# Patient Record
Sex: Female | Born: 1995 | Race: Black or African American | Hispanic: No | Marital: Married | Smoking: Never smoker
Health system: Southern US, Community
[De-identification: ages and names within clinical notes are randomized; demographics above are authoritative.]

## PROBLEM LIST (undated history)

## (undated) DIAGNOSIS — Z789 Other specified health status: Secondary | ICD-10-CM

## (undated) DIAGNOSIS — D649 Anemia, unspecified: Secondary | ICD-10-CM

## (undated) DIAGNOSIS — N390 Urinary tract infection, site not specified: Secondary | ICD-10-CM

## (undated) HISTORY — PX: WISDOM TOOTH EXTRACTION: SHX21

## (undated) HISTORY — DX: Urinary tract infection, site not specified: N39.0

---
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
## 2014-04-16 NOTE — L&D Delivery Note (Signed)
Patient is 19 y.o. [id number] [redacted]w[redacted]d admitted PROM  with SOL. Progressed well without augmentation.    Delivery Note At 2:39 PM a viable female was delivered via Vaginal, Spontaneous Delivery (Presentation: Left Occiput Anterior).  APGAR: 7, 9; weight -pending.   Placenta status: Intact, Spontaneous.  Cord: 3 vessels with the following complications: None.    Anesthesia: Epidural  Episiotomy: None Lacerations: 2nd degree;Perineal;Labial Suture Repair: 3.0 vicryl Est. Blood Loss (mL):    Mom to postpartum.  Baby to Couplet care / Skin to Skin.  Durenda Hurt 01/13/2015, 3:24 PM  OB fellow attestation: Patient is a [id number] at [redacted]w[redacted]d who was admitted for SOL/PROM uncomplicated prenatal course.  I was gloved and present for delivery in its entirety.  Second stage of labor progressed, baby delivered after <30 minutes of pushing. Variable decels during second stage noted. Infant was born with nuchal x1 reduced at perineum and body cord x1.    Complications: none  Lacerations: 2nd degree, repaired in typical fashion  EBL:  Federico Flake, MD 3:34 PM

## 2014-08-23 ENCOUNTER — Other Ambulatory Visit (HOSPITAL_COMMUNITY): Payer: Self-pay | Admitting: Physician Assistant

## 2014-08-23 DIAGNOSIS — Z3689 Encounter for other specified antenatal screening: Secondary | ICD-10-CM

## 2014-08-23 LAB — OB RESULTS CONSOLE RUBELLA ANTIBODY, IGM: RUBELLA: IMMUNE

## 2014-08-23 LAB — OB RESULTS CONSOLE RPR: RPR: NONREACTIVE

## 2014-08-23 LAB — OB RESULTS CONSOLE GC/CHLAMYDIA
CHLAMYDIA, DNA PROBE: NEGATIVE
GC PROBE AMP, GENITAL: NEGATIVE

## 2014-08-23 LAB — OB RESULTS CONSOLE ANTIBODY SCREEN: ANTIBODY SCREEN: NEGATIVE

## 2014-08-23 LAB — OB RESULTS CONSOLE ABO/RH: RH Type: POSITIVE

## 2014-08-23 LAB — OB RESULTS CONSOLE HIV ANTIBODY (ROUTINE TESTING): HIV: NONREACTIVE

## 2014-08-23 LAB — OB RESULTS CONSOLE HEPATITIS B SURFACE ANTIGEN: Hepatitis B Surface Ag: NEGATIVE

## 2014-08-26 ENCOUNTER — Other Ambulatory Visit (HOSPITAL_COMMUNITY): Payer: Self-pay | Admitting: Physician Assistant

## 2014-08-26 ENCOUNTER — Ambulatory Visit (HOSPITAL_COMMUNITY)
Admission: RE | Admit: 2014-08-26 | Discharge: 2014-08-26 | Disposition: A | Discharge status: Home / Self Care | Payer: Medicaid Other | Source: Ambulatory Visit | Attending: Physician Assistant | Admitting: Physician Assistant

## 2014-08-26 ENCOUNTER — Encounter (HOSPITAL_COMMUNITY): Payer: Self-pay

## 2014-08-26 DIAGNOSIS — Z3A19 19 weeks gestation of pregnancy: Secondary | ICD-10-CM | POA: Insufficient documentation

## 2014-08-26 DIAGNOSIS — Z3689 Encounter for other specified antenatal screening: Secondary | ICD-10-CM

## 2014-08-26 DIAGNOSIS — Z36 Encounter for antenatal screening of mother: Secondary | ICD-10-CM | POA: Insufficient documentation

## 2014-09-20 ENCOUNTER — Other Ambulatory Visit (HOSPITAL_COMMUNITY): Payer: Self-pay | Admitting: Urology

## 2014-09-20 DIAGNOSIS — Z3689 Encounter for other specified antenatal screening: Secondary | ICD-10-CM

## 2014-09-24 ENCOUNTER — Ambulatory Visit (HOSPITAL_COMMUNITY)
Admission: RE | Admit: 2014-09-24 | Discharge: 2014-09-24 | Disposition: A | Discharge status: Home / Self Care | Payer: Medicaid Other | Source: Ambulatory Visit | Attending: Urology | Admitting: Urology

## 2014-09-24 ENCOUNTER — Ambulatory Visit (HOSPITAL_COMMUNITY): Payer: Medicaid Other

## 2014-09-24 DIAGNOSIS — Z36 Encounter for antenatal screening of mother: Secondary | ICD-10-CM | POA: Insufficient documentation

## 2014-09-24 DIAGNOSIS — Z3689 Encounter for other specified antenatal screening: Secondary | ICD-10-CM

## 2014-12-21 LAB — OB RESULTS CONSOLE GBS: STREP GROUP B AG: NEGATIVE

## 2015-01-13 ENCOUNTER — Inpatient Hospital Stay (HOSPITAL_COMMUNITY)
Admission: AD | Admit: 2015-01-13 | Discharge: 2015-01-15 | DRG: 775 | Disposition: A | Discharge status: Home / Self Care | Payer: Medicaid Other | Source: Ambulatory Visit | Attending: Obstetrics & Gynecology | Admitting: Obstetrics & Gynecology

## 2015-01-13 ENCOUNTER — Inpatient Hospital Stay (HOSPITAL_COMMUNITY): Payer: Medicaid Other | Admitting: Anesthesiology

## 2015-01-13 ENCOUNTER — Encounter (HOSPITAL_COMMUNITY): Payer: Self-pay | Admitting: *Deleted

## 2015-01-13 DIAGNOSIS — Z3A39 39 weeks gestation of pregnancy: Secondary | ICD-10-CM

## 2015-01-13 DIAGNOSIS — O4292 Full-term premature rupture of membranes, unspecified as to length of time between rupture and onset of labor: Principal | ICD-10-CM | POA: Diagnosis present

## 2015-01-13 DIAGNOSIS — O4290 Premature rupture of membranes, unspecified as to length of time between rupture and onset of labor, unspecified weeks of gestation: Secondary | ICD-10-CM | POA: Diagnosis present

## 2015-01-13 HISTORY — DX: Other specified health status: Z78.9

## 2015-01-13 LAB — CBC
HCT: 31.9 % — ABNORMAL LOW (ref 36.0–46.0)
HEMOGLOBIN: 10.4 g/dL — AB (ref 12.0–15.0)
MCH: 29.6 pg (ref 26.0–34.0)
MCHC: 32.6 g/dL (ref 30.0–36.0)
MCV: 90.9 fL (ref 78.0–100.0)
Platelets: 199 10*3/uL (ref 150–400)
RBC: 3.51 MIL/uL — ABNORMAL LOW (ref 3.87–5.11)
RDW: 15 % (ref 11.5–15.5)
WBC: 7.2 10*3/uL (ref 4.0–10.5)

## 2015-01-13 LAB — TYPE AND SCREEN
ABO/RH(D): O POS
ANTIBODY SCREEN: NEGATIVE

## 2015-01-13 LAB — POCT FERN TEST: POCT Fern Test: POSITIVE

## 2015-01-13 LAB — ABO/RH: ABO/RH(D): O POS

## 2015-01-13 LAB — RPR: RPR Ser Ql: NONREACTIVE

## 2015-01-13 MED ORDER — LANOLIN HYDROUS EX OINT: TOPICAL_OINTMENT | CUTANEOUS | Status: DC | PRN

## 2015-01-13 MED ORDER — DIPHENHYDRAMINE HCL 50 MG/ML IJ SOLN: 12.5000 mg | INTRAMUSCULAR | Status: DC | PRN

## 2015-01-13 MED ORDER — EPHEDRINE 5 MG/ML INJ: 10.0000 mg | INTRAVENOUS | Status: DC | PRN

## 2015-01-13 MED ORDER — LIDOCAINE HCL (PF) 1 % IJ SOLN
30.0000 mL | INTRAMUSCULAR | Status: DC | PRN
  Filled 2015-01-13: qty 30

## 2015-01-13 MED ORDER — BUPIVACAINE HCL (PF) 0.25 % IJ SOLN
INTRAMUSCULAR | Status: DC | PRN
  Administered 2015-01-13 (×2): 4 mL via EPIDURAL

## 2015-01-13 MED ORDER — DIBUCAINE 1 % RE OINT
1.0000 "application " | TOPICAL_OINTMENT | RECTAL | Status: DC | PRN
  Filled 2015-01-13: qty 28

## 2015-01-13 MED ORDER — ACETAMINOPHEN 325 MG PO TABS: 650.0000 mg | ORAL_TABLET | ORAL | Status: DC | PRN

## 2015-01-13 MED ORDER — LACTATED RINGERS IV SOLN
500.0000 mL | INTRAVENOUS | Status: DC | PRN
  Administered 2015-01-13 (×2): 1000 mL via INTRAVENOUS

## 2015-01-13 MED ORDER — LACTATED RINGERS IV SOLN
INTRAVENOUS | Status: DC
  Administered 2015-01-13 (×3): via INTRAVENOUS

## 2015-01-13 MED ORDER — LIDOCAINE-EPINEPHRINE (PF) 2 %-1:200000 IJ SOLN
INTRAMUSCULAR | Status: DC | PRN
  Administered 2015-01-13: 4 mL

## 2015-01-13 MED ORDER — FENTANYL CITRATE (PF) 100 MCG/2ML IJ SOLN
50.0000 ug | INTRAMUSCULAR | Status: DC | PRN
  Administered 2015-01-13 (×2): 100 ug via INTRAVENOUS
  Filled 2015-01-13 (×2): qty 2

## 2015-01-13 MED ORDER — BENZOCAINE-MENTHOL 20-0.5 % EX AERO
1.0000 "application " | INHALATION_SPRAY | CUTANEOUS | Status: DC | PRN
  Administered 2015-01-13: 1 via TOPICAL
  Filled 2015-01-13 (×2): qty 56

## 2015-01-13 MED ORDER — OXYTOCIN 40 UNITS IN LACTATED RINGERS INFUSION - SIMPLE MED
62.5000 mL/h | INTRAVENOUS | Status: DC
  Administered 2015-01-13: 62.5 mL/h via INTRAVENOUS
  Filled 2015-01-13: qty 1000

## 2015-01-13 MED ORDER — INFLUENZA VAC SPLIT QUAD 0.5 ML IM SUSY
0.5000 mL | PREFILLED_SYRINGE | INTRAMUSCULAR | Status: AC
  Administered 2015-01-15: 0.5 mL via INTRAMUSCULAR

## 2015-01-13 MED ORDER — PRENATAL MULTIVITAMIN CH
1.0000 | ORAL_TABLET | Freq: Every day | ORAL | Status: DC
  Administered 2015-01-14 – 2015-01-15 (×2): 1 via ORAL
  Filled 2015-01-13 (×2): qty 1

## 2015-01-13 MED ORDER — OXYCODONE-ACETAMINOPHEN 5-325 MG PO TABS
1.0000 | ORAL_TABLET | ORAL | Status: DC | PRN
Start: 2015-01-13 — End: 2015-01-15

## 2015-01-13 MED ORDER — FENTANYL 2.5 MCG/ML BUPIVACAINE 1/10 % EPIDURAL INFUSION (WH - ANES)
14.0000 mL/h | INTRAMUSCULAR | Status: DC | PRN
  Administered 2015-01-13: 12 mL/h via EPIDURAL
  Filled 2015-01-13: qty 125

## 2015-01-13 MED ORDER — OXYCODONE-ACETAMINOPHEN 5-325 MG PO TABS: 2.0000 | ORAL_TABLET | ORAL | Status: DC | PRN

## 2015-01-13 MED ORDER — IBUPROFEN 600 MG PO TABS
600.0000 mg | ORAL_TABLET | Freq: Four times a day (QID) | ORAL | Status: DC
  Administered 2015-01-13 – 2015-01-15 (×8): 600 mg via ORAL
  Filled 2015-01-13 (×8): qty 1

## 2015-01-13 MED ORDER — CITRIC ACID-SODIUM CITRATE 334-500 MG/5ML PO SOLN: 30.0000 mL | ORAL | Status: DC | PRN

## 2015-01-13 MED ORDER — PHENYLEPHRINE 40 MCG/ML (10ML) SYRINGE FOR IV PUSH (FOR BLOOD PRESSURE SUPPORT)
80.0000 ug | PREFILLED_SYRINGE | INTRAVENOUS | Status: DC | PRN
  Filled 2015-01-13: qty 20

## 2015-01-13 MED ORDER — ONDANSETRON HCL 4 MG PO TABS: 4.0000 mg | ORAL_TABLET | ORAL | Status: DC | PRN

## 2015-01-13 MED ORDER — SENNOSIDES-DOCUSATE SODIUM 8.6-50 MG PO TABS
2.0000 | ORAL_TABLET | ORAL | Status: DC
  Administered 2015-01-14 (×2): 2 via ORAL
  Filled 2015-01-13 (×2): qty 2

## 2015-01-13 MED ORDER — OXYTOCIN BOLUS FROM INFUSION
500.0000 mL | INTRAVENOUS | Status: DC
  Administered 2015-01-13: 500 mL via INTRAVENOUS

## 2015-01-13 MED ORDER — OXYCODONE-ACETAMINOPHEN 5-325 MG PO TABS: 1.0000 | ORAL_TABLET | ORAL | Status: DC | PRN

## 2015-01-13 MED ORDER — WITCH HAZEL-GLYCERIN EX PADS: 1.0000 "application " | MEDICATED_PAD | CUTANEOUS | Status: DC | PRN

## 2015-01-13 MED ORDER — ONDANSETRON HCL 4 MG/2ML IJ SOLN: 4.0000 mg | Freq: Four times a day (QID) | INTRAMUSCULAR | Status: DC | PRN

## 2015-01-13 MED ORDER — DIPHENHYDRAMINE HCL 25 MG PO CAPS: 25.0000 mg | ORAL_CAPSULE | Freq: Four times a day (QID) | ORAL | Status: DC | PRN

## 2015-01-13 MED ORDER — ZOLPIDEM TARTRATE 5 MG PO TABS: 5.0000 mg | ORAL_TABLET | Freq: Every evening | ORAL | Status: DC | PRN

## 2015-01-13 MED ORDER — ONDANSETRON HCL 4 MG/2ML IJ SOLN: 4.0000 mg | INTRAMUSCULAR | Status: DC | PRN

## 2015-01-13 MED ORDER — SIMETHICONE 80 MG PO CHEW: 80.0000 mg | CHEWABLE_TABLET | ORAL | Status: DC | PRN

## 2015-01-13 NOTE — Anesthesia Preprocedure Evaluation (Addendum)
Anesthesia Evaluation  Patient identified by MRN, date of birth, ID band Patient awake    Reviewed: Allergy & Precautions, Patient's Chart, lab work & pertinent test results  History of Anesthesia Complications Negative for: history of anesthetic complications  Airway Mallampati: III  TM Distance: >3 FB Neck ROM: Full    Dental  (+) Teeth Intact   Pulmonary neg pulmonary ROS,    breath sounds clear to auscultation       Cardiovascular negative cardio ROS   Rhythm:Regular     Neuro/Psych negative neurological ROS  negative psych ROS   GI/Hepatic negative GI ROS, Neg liver ROS,   Endo/Other  negative endocrine ROS  Renal/GU negative Renal ROS     Musculoskeletal   Abdominal   Peds  Hematology negative hematology ROS (+)   Anesthesia Other Findings   Reproductive/Obstetrics (+) Pregnancy                            Anesthesia Physical Anesthesia Plan  ASA: II  Anesthesia Plan: Epidural   Post-op Pain Management:    Induction:   Airway Management Planned: Nasal Cannula  Additional Equipment:   Intra-op Plan:   Post-operative Plan:   Informed Consent: I have reviewed the patients History and Physical, chart, labs and discussed the procedure including the risks, benefits and alternatives for the proposed anesthesia with the patient or authorized representative who has indicated his/her understanding and acceptance.     Plan Discussed with: Anesthesiologist  Anesthesia Plan Comments:         Anesthesia Quick Evaluation

## 2015-01-13 NOTE — Anesthesia Postprocedure Evaluation (Signed)
Anesthesia Post Note  Patient: Catherine Winters  Procedure(s) Performed: * No procedures listed *  Anesthesia type: Epidural  Patient location: Mother/Baby  Post pain: Pain level controlled  Post assessment: Post-op Vital signs reviewed  Last Vitals:  Filed Vitals:   01/13/15 1800  BP: 105/49  Pulse: 81  Temp:   Resp: 18    Post vital signs: Reviewed  Level of consciousness:alert  Complications: No apparent anesthesia complications

## 2015-01-13 NOTE — Anesthesia Procedure Notes (Signed)
Epidural Patient location during procedure: OB  Staffing Anesthesiologist: MOSER, CHRISTOPHER Performed by: anesthesiologist   Preanesthetic Checklist Completed: patient identified, surgical consent, pre-op evaluation, timeout performed, IV checked, risks and benefits discussed and monitors and equipment checked  Epidural Patient position: sitting Prep: DuraPrep Patient monitoring: heart rate, cardiac monitor, continuous pulse ox and blood pressure Approach: midline Location: L3-L4 Injection technique: LOR saline  Needle:  Needle type: Tuohy  Needle gauge: 17 G Needle length: 9 cm Needle insertion depth: 6 cm Catheter type: closed end flexible Catheter size: 19 Gauge Catheter at skin depth: 12 cm Test dose: negative and 2% lidocaine with Epi 1:200 K  Assessment Events: blood not aspirated, injection not painful, no injection resistance, negative IV test and no paresthesia  Additional Notes Reason for block:procedure for pain   

## 2015-01-13 NOTE — H&P (Signed)
LABOR ADMISSION HISTORY AND PHYSICAL  Catherine Winters is a 19 y.o. female G2P0010 with IUP at [redacted]w[redacted]d by LMP (04/10/14) presenting for PROM. She reports +FM, + contractions, No LOF, no VB, no blurry vision, headaches or peripheral edema, and RUQ pain.  She plans on breast feeding. She is undecided on method of contraception.  Dating: By LMP (04/10/14) --->  Estimated Date of Delivery: 01/15/15  Sono:    , CWD, normal anatomy, cephalic presentation, longitudinal lie, 691g, 62% EFW   Past Medical History: Past Medical History  Diagnosis Date  . Medical history non-contributory     Past Surgical History: Past Surgical History  Procedure Laterality Date  . Wisdom tooth extraction      Obstetrical History: OB History    Gravida Para Term Preterm AB TAB SAB Ectopic Multiple Living        Social History: Social History   Social History  . Marital Status: Married    Spouse Name: N/A  . Number of Children: N/A  . Years of Education: N/A   Social History Main Topics  . Smoking status: Never Smoker   . Smokeless tobacco: None  . Alcohol Use: No  . Drug Use: No  . Sexual Activity: Not Currently   Other Topics Concern  . None   Social History Narrative    Family History: History reviewed. No pertinent family history.  Allergies: No Known Allergies  Prescriptions prior to admission  Medication Sig Dispense Refill Last Dose  . Prenatal Vit-Fe Fumarate-FA (MULTIVITAMIN-PRENATAL) 27-0.8 MG TABS tablet Take 1 tablet by mouth daily at 12 noon.   01/12/2015 at Unknown time  . [DISCONTINUED] HYDROcodone-acetaminophen (NORCO/VICODIN) 5-325 MG per tablet Take 2 tablets by mouth every 6 (six) hours as needed for pain. 20 tablet 0 More than a month at Unknown time  . [DISCONTINUED] nitrofurantoin, macrocrystal-monohydrate, (MACROBID) 100 MG capsule Take 1 capsule (100 mg total) by mouth 2 (two) times daily. 14 capsule 0 More than a month at Unknown time      Review of Systems   All systems reviewed and negative except as stated in HPI  BP 121/82 mmHg  Pulse 91  Temp(Src) 98.2 F (36.8 C) (Oral)  Resp 20  Ht  (1.702 m)  Wt 192 lb (87.091 kg)  BMI 30.06 kg/m2  SpO2 100%  LMP 04/10/2014 (Exact Date) General appearance: alert, cooperative and moderate distress Lungs: non-labored breathing Heart: regular rate Abdomen: soft, non-tender Extremities: no sign of DVT, no edema Presentation: cephalic Fetal monitoringBaseline: 130 bpm, Variability: Good {> 6 bpm) and Accelerations: Reactive Uterine activityFrequency: Every 3-4 minutes, Duration: 60-80 seconds and Intensity: moderate Dilation: 2 Effacement (%): 60 Exam by:: D Collison RN   Prenatal labs: ABO, Rh: --/--/O POS, O POS (09/29 0400)O Antibody: NEG (09/29 0400)Neg Rubella:  Immune RPR: Nonreactive (05/09 0000) Non-reactive HBsAg: Negative (05/09 0000) Neg HIV: Non-reactive (05/09 0000) Non-reactive GBS: Negative (09/06 0000) Neg 1 hr Glucola: 99 Genetic screening: No abnormalities Anatomy US: No abnormalities   Prenatal Transfer Tool  Maternal Diabetes: No Genetic Screening: Normal Maternal Ultrasounds/Referrals: Normal Fetal Ultrasounds or other Referrals:  None Maternal Substance Abuse:  No Significant Maternal Medications:  None Significant Maternal Lab Results: None  Results for orders placed or performed during the hospital encounter of 01/13/15 (from the past 24 hour(s))  Fern Test   Collection Time: 01/13/15  3:53 AM  Result Value Ref Range   POCT Fern Test Positive =  ruptured amniotic membanes   CBC   Collection Time: 01/13/15  4:00 AM  Result Value Ref Range   WBC 7.2 4.0 - 10.5 K/uL   RBC 3.51 (L) 3.87 - 5.11 MIL/uL   Hemoglobin 10.4 (L) 12.0 - 15.0 g/dL   HCT 16.1 (L) 09.6 - 04.5 %   MCV 90.9 78.0 - 100.0 fL   MCH 29.6 26.0 - 34.0 pg   MCHC 32.6 30.0 - 36.0 g/dL   RDW 40.9 81.1 - 91.4 %   Platelets 199 150 - 400 K/uL  Type and screen    Collection Time: 01/13/15  4:00 AM  Result Value Ref Range   ABO/RH(D) O POS    Antibody Screen NEG    Sample Expiration 01/16/2015   ABO/Rh   Collection Time: 01/13/15  4:00 AM  Result Value Ref Range   ABO/RH(D) O POS     Patient Active Problem List   Diagnosis Date Noted  . PROM (premature rupture of membranes) 01/13/2015  . Encounter for fetal anatomic survey   . [redacted] weeks gestation of pregnancy     Assessment: Catherine Winters is a 19 y.o. G2P0010 at [redacted]w[redacted]d here for IOL for PROM.  #Labor:Expectant management #Pain: Patient desires unmedicated labor #FWB: Cat 1 #ID:  GBS neg #MOF: Breast #MOC:Undecided #Circ:  Outpatient  Tarri Abernethy, MD PGY-1 [facility]   CNM attestation:  I have seen and examined this patient; I agree with above documentation in the resident's note.   Catherine Winters is a 19 y.o. G2P0010 here for SROM  PE: BP 124/77 mmHg  Pulse 83  Temp(Src) 98.7 F (37.1 C) (Oral)  Resp 20  Ht  (1.702 m)  Wt 87.091 kg (192 lb)  BMI 30.06 kg/m2  SpO2 100%  LMP 04/10/2014 (Exact Date) Gen: calm comfortable, NAD Resp: normal effort, no distress Abd: gravid  ROS, labs, PMH reviewed  Plan: Admit to [facility] Expectant management for now Anticipate SVD  Cam Hai CNM 01/13/2015, 8:44 AM

## 2015-01-14 NOTE — Progress Notes (Cosign Needed)
Post Partum Day 1 Subjective:  Catherine Winters is a 19 y.o. G2P1011 [redacted]w[redacted]d s/p SVD after SOL.  No acute events overnight.  Pt denies problems with ambulating, voiding or po intake.  She denies nausea or vomiting.  Pain is well controlled.  She has had flatus. She has not had bowel movement.  Lochia Small.  Plan for birth control is undecided.  Method of Feeding: breast  Objective: Blood pressure 115/60, pulse 74, temperature 99.4 F (37.4 C), temperature source Oral, resp. rate 20, height  (1.702 m), weight 192 lb (87.091 kg), last menstrual period 04/10/2014, SpO2 100 %, unknown if currently breastfeeding.  Physical Exam:  General: alert, cooperative and no distress Lochia:normal flow Chest: CTAB Heart: RRR no m/r/g Abdomen: +BS, soft, nontender,  Uterine Fundus: firm, at level of umbilicus DVT Evaluation: No evidence of DVT seen on physical exam. Extremities: no edema   Recent Labs  01/13/15 0400  HGB 10.4*  HCT 31.9*    Assessment/Plan:  ASSESSMENT: Catherine Winters is a 19 y.o. [id number] [redacted]w[redacted]d s/p SVD after SOL.  Plan for discharge tomorrow, Breastfeeding and Lactation consult   LOS: 1 day   Durenda Hurt 01/14/2015, 9:31 AM

## 2015-01-14 NOTE — Progress Notes (Signed)
UR chart review completed.  

## 2015-01-14 NOTE — Lactation Note (Signed)
This note was copied from the chart of Catherine Winters. Lactation Consultation Note New mom doing STS w/baby after bath. Mom stated baby BF good earlier. Mom has everted nipples, hand expression taught w/colostrum noted. Mom communicated well asking questions about pumping, freezing milk when she returns to work. Gave reference on storing and freezing milk in Baby and Me book. Referred to Baby and Me Book in Breastfeeding section Pg. 22-23 for position options and Proper latch demonstration.Mom encouraged to feed baby 8-12 times/24 hours and with feeding cues. Mom encouraged to do skin-to-skin. Mom has a pump at home. WH/LC brochure given w/resources, support groups and LC services. Patient Name: Catherine Dorlisa Savino [id number] Date: 01/14/2015 Reason for consult: Initial assessment   Maternal Data    Feeding Feeding Type: Breast Fed Length of feed: 15 min  LATCH Score/Interventions Latch: Too sleepy or reluctant, no latch achieved, no sucking elicited. Intervention(s): Skin to skin;Teach feeding cues;Waking techniques Intervention(s): Breast massage;Breast compression     Type of Nipple: Everted at rest and after stimulation  Comfort (Breast/Nipple): Soft / non-tender     Intervention(s): Skin to skin;Position options;Support Pillows;Breastfeeding basics reviewed     Lactation Tools Discussed/Used     Consult Status Consult Status: Follow-up Date: 01/14/15 Follow-up type: In-patient    CARVER, Diamond Nickel 01/14/2015, 12:01 AM

## 2015-01-15 ENCOUNTER — Ambulatory Visit: Payer: Self-pay

## 2015-01-15 MED ORDER — IBUPROFEN 600 MG PO TABS: 600.0000 mg | ORAL_TABLET | Freq: Four times a day (QID) | ORAL | Status: DC | PRN

## 2015-01-15 NOTE — Discharge Summary (Signed)
OB Discharge Summary     Patient Name: Catherine Winters DOB: 06-09-1995 MRN: [id number]  Date of admission: 01/13/2015 Delivering MD: Maryjo Rochester L   Date of discharge: 01/15/2015  Admitting diagnosis: 39 WEEKS ROM CTX Intrauterine pregnancy: [redacted]w[redacted]d     Secondary diagnosis: None     Discharge diagnosis: Term Pregnancy Delivered                                                                                                Post partum procedures:none  Augmentation: none  Complications: None  Hospital course:  Onset of Labor With Vaginal Delivery     19 y.o. yo G2P1011 at [redacted]w[redacted]d was admitted in Latent Labor with SROM on 01/13/2015. Patient had an uncomplicated labor course as follows:  Membrane Rupture Time/Date: 3:00 AM ,01/13/2015   Intrapartum Procedures: Episiotomy: None [1]                                         Lacerations:  2nd degree [3];Perineal [11];Labial [10]  Patient had a delivery of a Viable infant. 01/13/2015  Information for the patient's newborn:  Nakisha, Chai [[id number]]  Delivery Method: Vaginal, Spontaneous Delivery (Filed from Delivery Summary)    Pateint had an uncomplicated postpartum course.  She is ambulating, tolerating a regular diet, passing flatus, and urinating well. Patient is discharged home in stable condition on No discharge date for patient encounter.Marland Kitchen    Physical exam  Filed Vitals:   01/13/15 2140 01/14/15 0609 01/14/15 1858 01/15/15 0500  BP: 124/60 115/60 120/61 133/75  Pulse: 97 74 93 99  Temp: 98.3 F (36.8 C) 99.4 F (37.4 C) 98.7 F (37.1 C) 98.1 F (36.7 C)  TempSrc:  Oral Oral Oral  Resp: Height:      Weight:      SpO2: 100%      General: alert and cooperative Lochia: appropriate Uterine Fundus: firm Incision: N/A DVT Evaluation: No evidence of DVT seen on physical exam. Labs: Lab Results  Component Value Date   WBC 7.2 01/13/2015   HGB 10.4* 01/13/2015   HCT 31.9* 01/13/2015   MCV 90.9  01/13/2015   PLT 199 01/13/2015   CMP Latest Ref Rng 11/22/2012  Glucose 70 - 99 mg/dL 784(O)  BUN 6 - 23 mg/dL 5(L)  Creatinine 9.62 - 1.10 mg/dL 9.52  Sodium 841 - 324 mEq/L 133(L)  Potassium 3.5 - 5.1 mEq/L 3.5  Chloride 96 - 112 mEq/L 98  CO2 19 - 32 mEq/L 23  Calcium 8.4 - 10.5 mg/dL 9.8    Discharge instruction: per After Visit Summary and "Baby and Me Booklet".  Medications:   Medication List    TAKE these medications        ibuprofen 600 MG tablet  Commonly known as:  ADVIL,MOTRIN  Take 1 tablet (600 mg total) by mouth every 6 (six) hours as needed.     multivitamin-prenatal 27-0.8 MG Tabs tablet  Take 1 tablet by mouth daily at  12 noon.       Diet: routine diet  Activity: Advance as tolerated. Pelvic rest for 6 weeks.   Outpatient follow up:6 weeks  Postpartum contraception: Undecided  Newborn Data: Live born female  Birth Weight: 7 lb 7.2 oz (3380 g) APGAR: 7, 9  Baby Feeding: Breast Disposition:home with mother   01/15/2015 Cam Hai, CNM  9:05 AM

## 2015-01-15 NOTE — Lactation Note (Signed)
This note was copied from the chart of Catherine Zani Huesca. Lactation Consultation Note: assist mother with latching infant. Mother has a positional strip on the left nipple. Assist mother with latching infant on the left breast in cross cradle hold. Infant sustained latch for 30 mins. Observed frequent audible swallows. . Mother taught breast compression. Mother is active with WIC . She was advised to phone [facility] on Monday. Mother has a hand pump . Advised to prepump for a few mins to soften breast tissue before latching infant. Assist mother with placing infant in football hold on alternate breast. Infant sustained latch for another 10-15 mins. Reviewed baby and me book on engorgement. Discussed cluster feeding. Advised mother to do frequent skin to skin and cue base feed infant. Mother to follow up with Peds on Monday.   Patient Name: Catherine Winters [id number] Date: 01/15/2015 Reason for consult: Follow-up assessment   Maternal Data    Feeding Feeding Type: Breast Fed Length of feed: 30 min  LATCH Score/Interventions Latch: Grasps breast easily, tongue down, lips flanged, rhythmical sucking. Intervention(s): Adjust position;Assist with latch;Breast massage;Breast compression  Audible Swallowing: Spontaneous and intermittent Intervention(s): Skin to skin;Hand expression Intervention(s): Hand expression;Alternate breast massage  Type of Nipple: Everted at rest and after stimulation  Comfort (Breast/Nipple): Filling, red/small blisters or bruises, mild/mod discomfort     Hold (Positioning): Assistance needed to correctly position infant at breast and maintain latch. Intervention(s): Breastfeeding basics reviewed;Support Pillows;Position options;Skin to skin  LATCH Score: 8  Lactation Tools Discussed/Used     Consult Status Consult Status: Follow-up Date: 01/15/15 Follow-up type: In-patient    Stevan Born [facility] 01/15/2015, 2:07 PM

## 2015-01-15 NOTE — Discharge Instructions (Signed)

## 2016-01-01 IMAGING — US US OB FOLLOW-UP
1 series · 12 of 28 positions shown · non-contrast
Comparison: none

[Series 1: us ob follow up · 55 acquisitions, 12 frames shown]
[im 3/55]
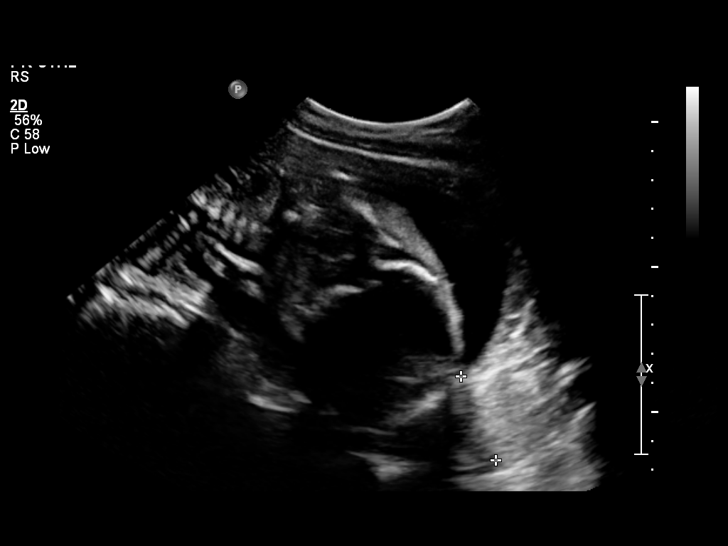
[im 7/55]
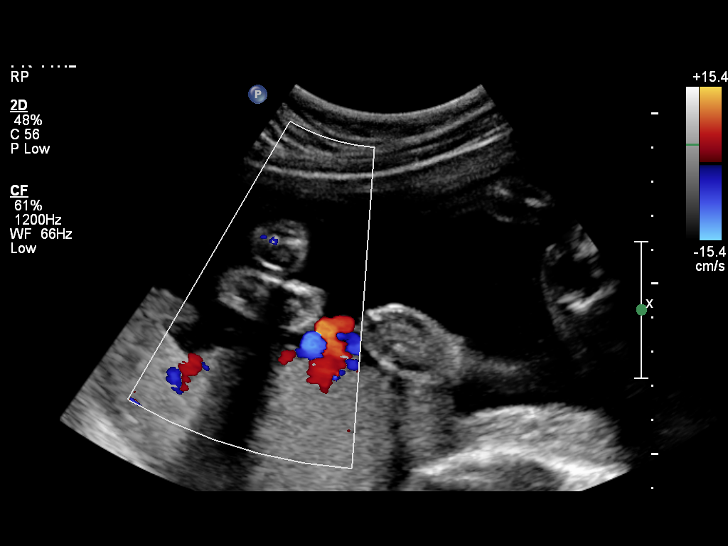
[im 11/55]
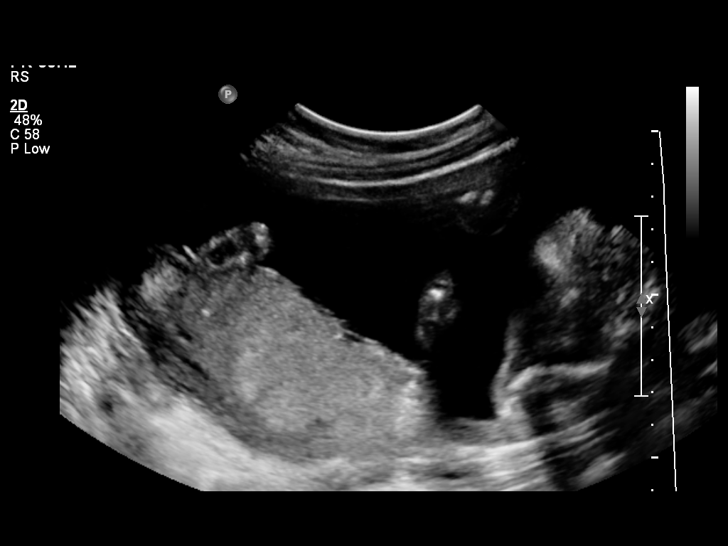
[im 17/55]
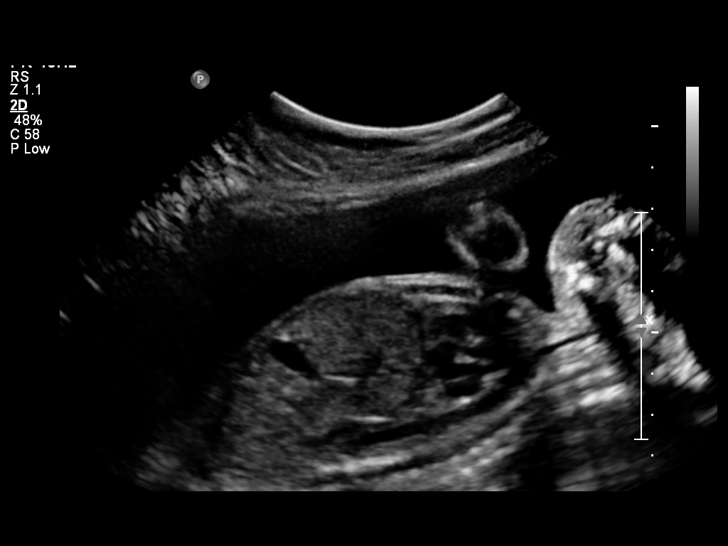
[im 21/55]
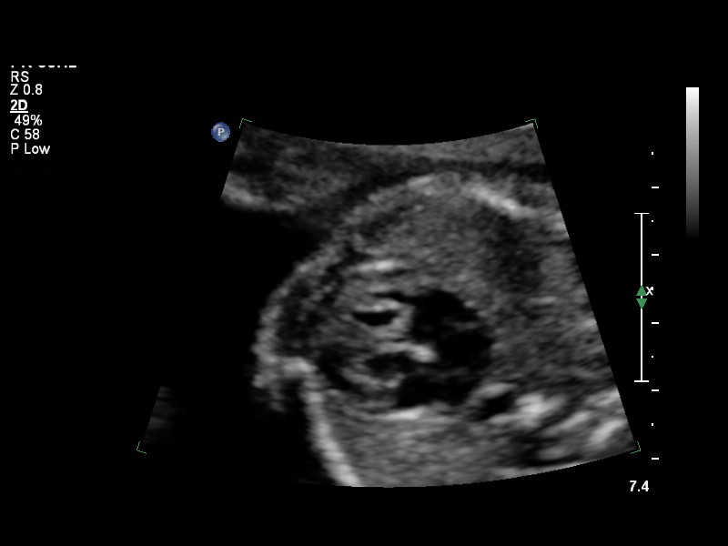
[im 25/55]
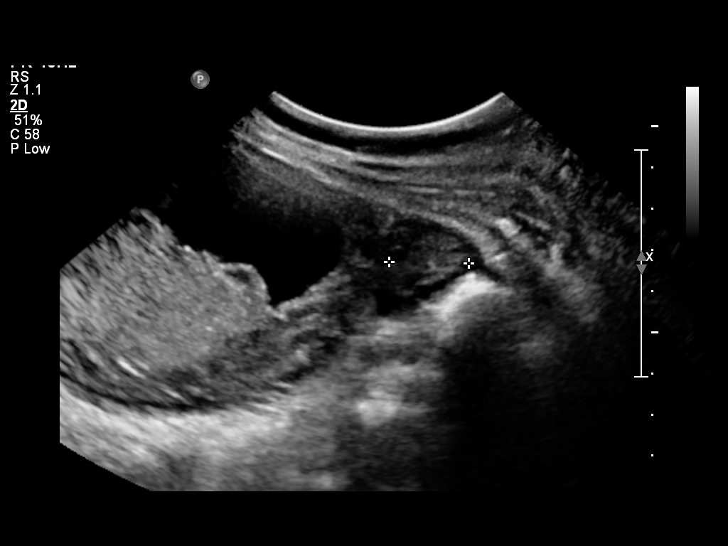
[im 31/55]
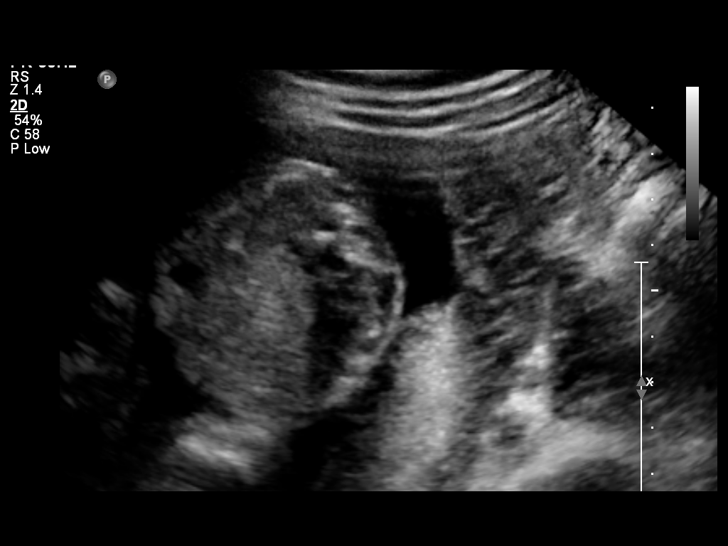
[im 35/55]
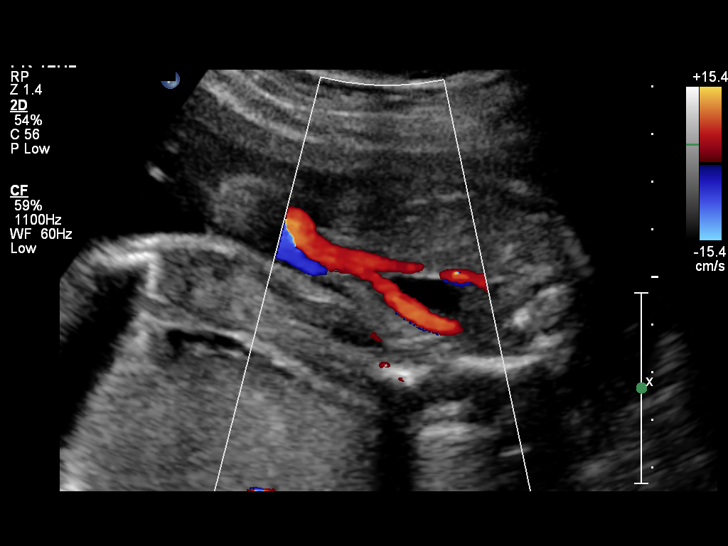
[im 39/55]
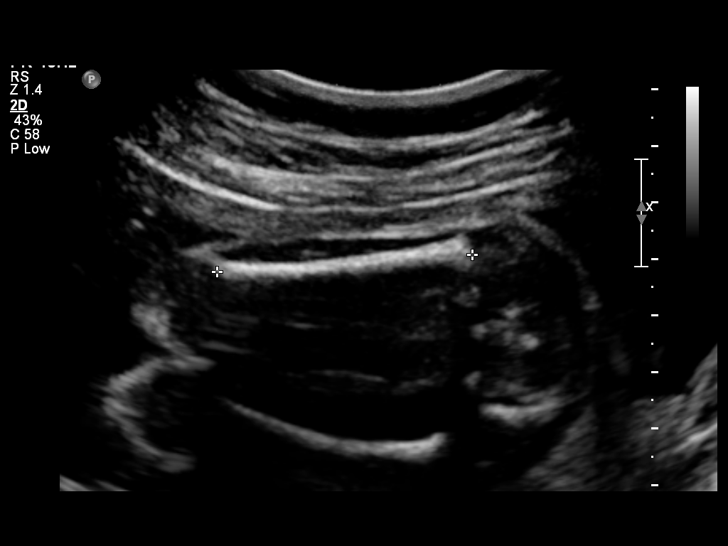
[im 45/55]
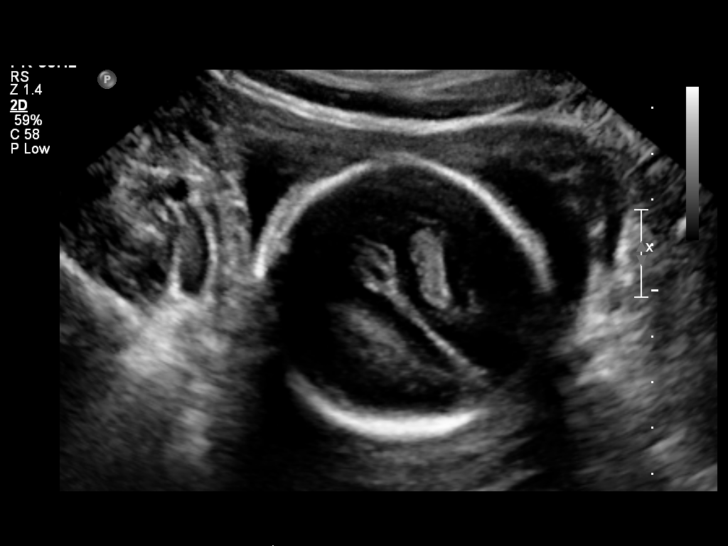
[im 49/55]
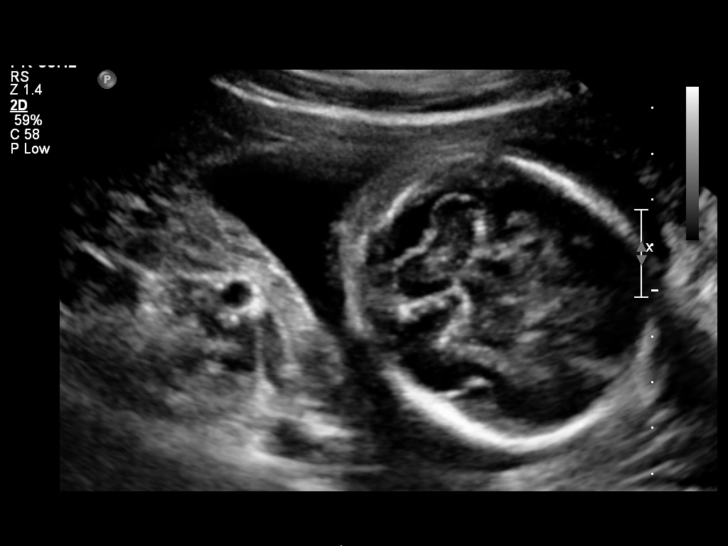
[im 53/55]
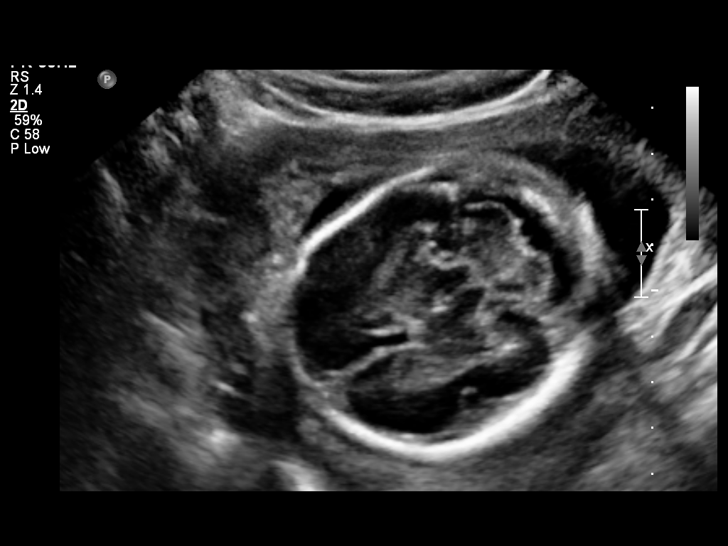

[12 of 28 positions shown; findings below may reference images not displayed]

OBSTETRICS REPORT
(Signed Final 09/27/2014 [DATE])

Service(s) Provided

US OB FOLLOW UP                                       76816.1
Indications

23 weeks gestation of pregnancy
Follow-up incomplete fetal anatomic evaluation        Z36
Fetal Evaluation

Num Of Fetuses:    1
Fetal Heart Rate:  147                          bpm
Cardiac Activity:  Observed
Presentation:      Cephalic
Placenta:          Posterior, above cervical
os
P. Cord            Visualized
Insertion:

Amniotic Fluid
AFI FV:      Subjectively within normal limits
Larg Pckt:    4.4  cm
Biometry

BPD:     57.7   mm    G. Age:  23w 5d                CI:        70.49    70 - 86
FL/HC:       20.3   18.7 -
20.9
HC:     219.1   mm    G. Age:  23w 6d       36   %   HC/AC:       1.11   1.05 -
1.21
AC:     196.8   mm    G. Age:  24w 3d       57   %   FL/BPD:      77.1   71 - 87
FL:      44.5   mm    G. Age:  24w 5d       63   %   FL/AC:       22.6   20 - 24
HUM:     42.7   mm    G. Age:  25w 4d       84   %
Est. FW:     691   gm     1 lb 8 oz     62  %
Gestational Age

LMP:           23w 6d        Date:  04/10/14                 EDD:    01/15/15
U/S Today:     24w 1d                                        EDD:    01/13/15
Best:          23w 6d     Det. By:  LMP  (04/10/14)          EDD:    01/15/15
Anatomy

Cranium:          Appears normal         Aortic Arch:       Previously seen
Fetal Cavum:      Appears normal         Ductal Arch:       Appears normal
Ventricles:       Appears normal         Diaphragm:         Appears normal
Choroid Plexus:   Appears normal         Stomach:           Appears normal, left
sided
Cerebellum:       Appears normal         Abdomen:           Appears normal
Posterior Fossa:  Appears normal         Abdominal Wall:    Appears nml (cord
insert, abd wall)
Nuchal Fold:      Previously seen        Cord Vessels:      Appears normal (3
vessel cord)
Face:             Appears normal         Kidneys:           Appear normal
(orbits and profile)
Lips:             Previously seen        Bladder:           Appears normal
Heart:            Appears normal         Spine:             Previously seen
(4CH, axis, and
situs)
RVOT:             Appears normal         Lower              Previously seen
Extremities:
LVOT:             Appears normal         Upper              Previously seen
Extremities:

Other:  Fetus appears to be a male. Heels and 5th digit prev.  visualized.
Technically difficult due to fetal position.
Cervix Uterus Adnexa

Cervical Length:    3.12      cm

Cervix:       Normal appearance by transabdominal scan.

Left Ovary:    Size(cm) L: 1.99 x W: 1.92 x H: 1.62  Volume(cc):
Right Ovary:   Not visualized. No adnexal mass visualized.
Impression

SIUP at 23+6 weeks
Normal interval anatomy; anatomic survey complete
Normal amniotic fluid volume
Appropriate interval growth with EFW at the 62nd %tile
Recommendations

Follow-up as clinically indicated

questions or concerns.

## 2016-04-16 NOTE — L&D Delivery Note (Signed)
Delivery Note At 3:38 AM a viable female was delivered via Vaginal, Spontaneous (Presentation: LOA).  APGAR:9,8; weight pending .   Placenta status: Delivered intact with gentle traction.  Cord:  with the following complications: shoulder dystocia  Cord pH: Not collected  Anesthesia: Epidural Episiotomy:  None Lacerations: Perineal;2nd degree Suture Repair: 3.0 vicryl Est. Blood Loss (mL): 250  Mom to postpartum.  Baby to Couplet care / Skin to Skin.  Lovena NeighboursAbdoulaye Diallo, MD 03/07/2017, 4:13 AM  I confirm that I have verified the information documented in the resident's note and that I have also personally performed the physical exam and all medical decision making activities.  I was gloved and present for entire delivery  SVD with shoulder dystocia.  Head emerged slowly so I had staff prepare for a possible shoulder dystocia.  Mcroberts employed with no success.  I attempted anterior shoulder again without success.  Suprapubic pressure was applied then applied again.  I was then able to get into anterior axilla and deliver anterior (right)shoulder then left/posterior.  Total time about 1 minute.  Lacerations as listed above Repair of same supervised by me  Aviva SignsMarie L Jasten Guyette, CNM

## 2016-09-24 LAB — OB RESULTS CONSOLE GC/CHLAMYDIA
Chlamydia: NEGATIVE
GC PROBE AMP, GENITAL: NEGATIVE

## 2016-09-24 LAB — OB RESULTS CONSOLE HIV ANTIBODY (ROUTINE TESTING): HIV: NONREACTIVE

## 2016-09-24 LAB — OB RESULTS CONSOLE RPR: RPR: NONREACTIVE

## 2016-09-24 LAB — OB RESULTS CONSOLE ABO/RH: RH Type: POSITIVE

## 2016-09-24 LAB — OB RESULTS CONSOLE HEPATITIS B SURFACE ANTIGEN: HEP B S AG: NEGATIVE

## 2016-09-24 LAB — OB RESULTS CONSOLE RUBELLA ANTIBODY, IGM: RUBELLA: IMMUNE

## 2016-09-24 LAB — OB RESULTS CONSOLE ANTIBODY SCREEN: ANTIBODY SCREEN: NEGATIVE

## 2017-02-04 LAB — OB RESULTS CONSOLE GC/CHLAMYDIA
CHLAMYDIA, DNA PROBE: NEGATIVE
Gonorrhea: NEGATIVE

## 2017-02-04 LAB — OB RESULTS CONSOLE GBS: STREP GROUP B AG: POSITIVE

## 2017-03-04 ENCOUNTER — Other Ambulatory Visit: Payer: Self-pay | Admitting: Obstetrics & Gynecology

## 2017-03-04 ENCOUNTER — Other Ambulatory Visit (HOSPITAL_COMMUNITY): Payer: Self-pay | Admitting: Family

## 2017-03-04 DIAGNOSIS — O480 Post-term pregnancy: Secondary | ICD-10-CM

## 2017-03-04 DIAGNOSIS — Z3A40 40 weeks gestation of pregnancy: Secondary | ICD-10-CM

## 2017-03-05 ENCOUNTER — Other Ambulatory Visit: Payer: Self-pay | Admitting: Advanced Practice Midwife

## 2017-03-05 ENCOUNTER — Encounter (HOSPITAL_COMMUNITY): Payer: Self-pay | Admitting: *Deleted

## 2017-03-06 ENCOUNTER — Inpatient Hospital Stay (HOSPITAL_COMMUNITY): Payer: Medicaid Other | Admitting: Anesthesiology

## 2017-03-06 ENCOUNTER — Inpatient Hospital Stay (HOSPITAL_COMMUNITY)
Admission: AD | Admit: 2017-03-06 | Discharge: 2017-03-09 | DRG: 807 | Disposition: A | Discharge status: Home / Self Care | Payer: Medicaid Other | Source: Ambulatory Visit | Attending: Obstetrics and Gynecology | Admitting: Obstetrics and Gynecology

## 2017-03-06 ENCOUNTER — Ambulatory Visit (HOSPITAL_COMMUNITY)
Admission: RE | Admit: 2017-03-06 | Discharge: 2017-03-06 | Disposition: A | Discharge status: Home / Self Care | Payer: Medicaid Other | Source: Ambulatory Visit | Attending: Obstetrics & Gynecology | Admitting: Obstetrics & Gynecology

## 2017-03-06 ENCOUNTER — Encounter (HOSPITAL_COMMUNITY): Payer: Self-pay | Admitting: *Deleted

## 2017-03-06 ENCOUNTER — Telehealth (HOSPITAL_COMMUNITY): Payer: Self-pay | Admitting: *Deleted

## 2017-03-06 ENCOUNTER — Encounter (HOSPITAL_COMMUNITY): Payer: Self-pay | Admitting: Obstetrics

## 2017-03-06 DIAGNOSIS — Z3A40 40 weeks gestation of pregnancy: Secondary | ICD-10-CM

## 2017-03-06 DIAGNOSIS — O480 Post-term pregnancy: Secondary | ICD-10-CM | POA: Diagnosis present

## 2017-03-06 DIAGNOSIS — O99824 Streptococcus B carrier state complicating childbirth: Secondary | ICD-10-CM | POA: Diagnosis present

## 2017-03-06 DIAGNOSIS — D649 Anemia, unspecified: Secondary | ICD-10-CM | POA: Diagnosis present

## 2017-03-06 DIAGNOSIS — O403XX0 Polyhydramnios, third trimester, not applicable or unspecified: Secondary | ICD-10-CM | POA: Diagnosis present

## 2017-03-06 DIAGNOSIS — O9902 Anemia complicating childbirth: Secondary | ICD-10-CM | POA: Diagnosis present

## 2017-03-06 DIAGNOSIS — Z3A41 41 weeks gestation of pregnancy: Secondary | ICD-10-CM

## 2017-03-06 HISTORY — DX: Anemia, unspecified: D64.9

## 2017-03-06 LAB — CBC
HCT: 31.9 % — ABNORMAL LOW (ref 36.0–46.0)
Hemoglobin: 10.2 g/dL — ABNORMAL LOW (ref 12.0–15.0)
MCH: 30.2 pg (ref 26.0–34.0)
MCHC: 32 g/dL (ref 30.0–36.0)
MCV: 94.4 fL (ref 78.0–100.0)
PLATELETS: 239 10*3/uL (ref 150–400)
RBC: 3.38 MIL/uL — AB (ref 3.87–5.11)
RDW: 15.4 % (ref 11.5–15.5)
WBC: 7.2 10*3/uL (ref 4.0–10.5)

## 2017-03-06 LAB — TYPE AND SCREEN
ABO/RH(D): O POS
Antibody Screen: NEGATIVE

## 2017-03-06 MED ORDER — TERBUTALINE SULFATE 1 MG/ML IJ SOLN
0.2500 mg | Freq: Once | INTRAMUSCULAR | Status: DC | PRN
  Filled 2017-03-06: qty 1

## 2017-03-06 MED ORDER — DEXTROSE 5 % IV SOLN
5.0000 10*6.[IU] | Freq: Once | INTRAVENOUS | Status: AC
  Administered 2017-03-06: 5 10*6.[IU] via INTRAVENOUS
  Filled 2017-03-06: qty 5

## 2017-03-06 MED ORDER — DIPHENHYDRAMINE HCL 50 MG/ML IJ SOLN: 12.5000 mg | INTRAMUSCULAR | Status: DC | PRN

## 2017-03-06 MED ORDER — PHENYLEPHRINE 40 MCG/ML (10ML) SYRINGE FOR IV PUSH (FOR BLOOD PRESSURE SUPPORT)
80.0000 ug | PREFILLED_SYRINGE | INTRAVENOUS | Status: DC | PRN
  Filled 2017-03-06: qty 5

## 2017-03-06 MED ORDER — EPHEDRINE 5 MG/ML INJ
10.0000 mg | INTRAVENOUS | Status: DC | PRN
  Filled 2017-03-06: qty 2

## 2017-03-06 MED ORDER — ACETAMINOPHEN 325 MG PO TABS: 650.0000 mg | ORAL_TABLET | ORAL | Status: DC | PRN

## 2017-03-06 MED ORDER — LACTATED RINGERS IV SOLN: 500.0000 mL | Freq: Once | INTRAVENOUS | Status: DC

## 2017-03-06 MED ORDER — OXYTOCIN 40 UNITS IN LACTATED RINGERS INFUSION - SIMPLE MED: 2.5000 [IU]/h | INTRAVENOUS | Status: DC

## 2017-03-06 MED ORDER — OXYCODONE-ACETAMINOPHEN 5-325 MG PO TABS: 1.0000 | ORAL_TABLET | ORAL | Status: DC | PRN

## 2017-03-06 MED ORDER — LACTATED RINGERS IV SOLN
INTRAVENOUS | Status: DC
  Administered 2017-03-06 (×2): via INTRAVENOUS

## 2017-03-06 MED ORDER — OXYTOCIN 40 UNITS IN LACTATED RINGERS INFUSION - SIMPLE MED
1.0000 m[IU]/min | INTRAVENOUS | Status: DC
  Administered 2017-03-06: 2 m[IU]/min via INTRAVENOUS
  Filled 2017-03-06: qty 1000

## 2017-03-06 MED ORDER — FENTANYL 2.5 MCG/ML BUPIVACAINE 1/10 % EPIDURAL INFUSION (WH - ANES)
14.0000 mL/h | INTRAMUSCULAR | Status: DC | PRN
  Filled 2017-03-06: qty 100

## 2017-03-06 MED ORDER — MISOPROSTOL 25 MCG QUARTER TABLET
25.0000 ug | ORAL_TABLET | ORAL | Status: DC | PRN
  Administered 2017-03-06: 25 ug via VAGINAL
  Filled 2017-03-06 (×3): qty 1

## 2017-03-06 MED ORDER — SOD CITRATE-CITRIC ACID 500-334 MG/5ML PO SOLN: 30.0000 mL | ORAL | Status: DC | PRN

## 2017-03-06 MED ORDER — OXYCODONE-ACETAMINOPHEN 5-325 MG PO TABS: 2.0000 | ORAL_TABLET | ORAL | Status: DC | PRN

## 2017-03-06 MED ORDER — PENICILLIN G POT IN DEXTROSE 60000 UNIT/ML IV SOLN
3.0000 10*6.[IU] | INTRAVENOUS | Status: DC
  Administered 2017-03-06 (×2): 3 10*6.[IU] via INTRAVENOUS
  Filled 2017-03-06 (×11): qty 50

## 2017-03-06 MED ORDER — PHENYLEPHRINE 40 MCG/ML (10ML) SYRINGE FOR IV PUSH (FOR BLOOD PRESSURE SUPPORT)
80.0000 ug | PREFILLED_SYRINGE | INTRAVENOUS | Status: DC | PRN
  Filled 2017-03-06: qty 5
  Filled 2017-03-06: qty 10

## 2017-03-06 MED ORDER — FENTANYL CITRATE (PF) 100 MCG/2ML IJ SOLN: 100.0000 ug | INTRAMUSCULAR | Status: DC | PRN

## 2017-03-06 MED ORDER — LACTATED RINGERS IV SOLN: 500.0000 mL | INTRAVENOUS | Status: DC | PRN

## 2017-03-06 MED ORDER — ONDANSETRON HCL 4 MG/2ML IJ SOLN: 4.0000 mg | Freq: Four times a day (QID) | INTRAMUSCULAR | Status: DC | PRN

## 2017-03-06 MED ORDER — OXYTOCIN BOLUS FROM INFUSION
500.0000 mL | Freq: Once | INTRAVENOUS | Status: AC
  Administered 2017-03-07: 500 mL via INTRAVENOUS

## 2017-03-06 MED ORDER — LIDOCAINE HCL (PF) 1 % IJ SOLN
30.0000 mL | INTRAMUSCULAR | Status: DC | PRN
  Administered 2017-03-07: 30 mL via SUBCUTANEOUS
  Filled 2017-03-06: qty 30

## 2017-03-06 NOTE — Telephone Encounter (Signed)
Preadmission screen  

## 2017-03-06 NOTE — H&P (Signed)
LABOR AND DELIVERY ADMISSION HISTORY AND PHYSICAL NOTE  Noel Christmasaofikat Nazir is a 21 y.o. female G3P1011 with IUP at 1766w4d by LMP + 19w U/S presenting for IOL for polyhydramnios. Had BPP done today for postdates, and noted to have mild polyhydramnios (AFI 29, MVP 10.7)  She reports positive fetal movement. She denies leakage of fluid or vaginal bleeding.  [facility]/Complications[facility] Pregnancy complications:  - Varicella non-immune - Anemia - GBS positive  Past Medical History: Past Medical History:  Diagnosis Date  . Anemia   . Medical history non-contributory     Past Surgical History: Past Surgical History:  Procedure Laterality Date  . WISDOM TOOTH EXTRACTION      Obstetrical History: OB History    Gravida Para Term Preterm AB Living   3 1 1  0 1 1   SAB TAB Ectopic Multiple Live Births   1 0 0 0 1      Social History: Social History   Socioeconomic History  . Marital status: Married    Spouse name: None  . Number of children: None  . Years of education: None  . Highest education level: None  Social Needs  . Financial resource strain: None  . Food insecurity - worry: None  . Food insecurity - inability: None  . Transportation needs - medical: None  . Transportation needs - non-medical: None  Occupational History  . None  Tobacco Use  . Smoking status: Never Smoker  . Smokeless tobacco: Never Used  Substance and Sexual Activity  . Alcohol use: No  . Drug use: No  . Sexual activity: Not Currently  Other Topics Concern  . None  Social History Narrative  . None    Family History: History reviewed. No pertinent family history.  Allergies: No Known Allergies  Medications Prior to Admission  Medication Sig Dispense Refill Last Dose  . acetaminophen (TYLENOL) 500 MG tablet Take 500 mg by mouth every 6 (six) hours as needed for moderate pain or headache.   03/03/2017  . ferrous sulfate 325 (65 FE) MG tablet Take 325 mg by mouth 2 (two)  times daily with a meal.   03/05/2017 at Unknown time  . Prenatal Vit-Fe Fumarate-FA (MULTIVITAMIN-PRENATAL) 27-0.8 MG TABS tablet Take 1 tablet by mouth daily at 12 noon.   03/05/2017 at Unknown time  . ibuprofen (ADVIL,MOTRIN) 600 MG tablet Take 1 tablet (600 mg total) by mouth every 6 (six) hours as needed. (Patient not taking: Reported on 03/06/2017) 30 tablet 0 Not Taking at Unknown time     Review of Systems  All systems reviewed and negative except as stated in HPI  Physical Exam Blood pressure 128/75, pulse (!) 115, temperature 98.3 F (36.8 C), temperature source Oral, resp. rate 18, height 5\' 6"  (1.676 m), weight 233 lb (105.7 kg), last menstrual period 05/26/2016, unknown if currently breastfeeding. General appearance: alert, cooperative and no distress Lungs: normal WOB Heart: regular rate  Abdomen: soft, non-tender; gravid Extremities: No calf swelling or tenderness Presentation: cephalic Fetal monitoring: baseline rate 145, moderate variability, +acel, no decel Uterine activity: UI Dilation: Fingertip Effacement (%): 50 Station: -2 Exam by:: Cher NakaiKristin Mcleod RN  Prenatal labs: ABO, Rh: --/--/O POS (11/21 1510) Antibody: NEG (11/21 1510) Rubella: Immune (06/11 0000) RPR: Nonreactive (06/11 0000)  HBsAg: Negative (06/11 0000)  HIV: Non-reactive (06/11 0000)  GC/Chlamydia: negative GBS: Positive (10/22 0000)  1 hr Glucola: 119 Genetic screening:  Negative Quad screen Anatomy US: normal  Prenatal Transfer Tool  Maternal Diabetes:  No Genetic Screening: Normal Maternal Ultrasounds/Referrals: Normal Fetal Ultrasounds or other Referrals:  None Maternal Substance Abuse:  No Significant Maternal Medications:  None Significant Maternal Lab Results: Lab values include: Group B Strep positive  Results for orders placed or performed during the hospital encounter of 03/06/17 (from the past 24 hour(s))  CBC   Collection Time: 03/06/17  3:02 PM  Result Value Ref Range    WBC 7.2 4.0 - 10.5 K/uL   RBC 3.38 (L) 3.87 - 5.11 MIL/uL   Hemoglobin 10.2 (L) 12.0 - 15.0 g/dL   HCT 11.931.9 (L) 14.736.0 - 82.946.0 %   MCV 94.4 78.0 - 100.0 fL   MCH 30.2 26.0 - 34.0 pg   MCHC 32.0 30.0 - 36.0 g/dL   RDW 56.215.4 13.011.5 - 86.515.5 %   Platelets 239 150 - 400 K/uL  Type and screen   Collection Time: 03/06/17  3:10 PM  Result Value Ref Range   ABO/RH(D) O POS    Antibody Screen NEG    Sample Expiration 03/09/2017     Patient Active Problem List   Diagnosis Date Noted  . Post term pregnancy at [redacted] weeks gestation 03/06/2017    Assessment: Noel Christmasaofikat Ohmann is a 21 y.o. G3P1011 at 7152w4d here for IOL for polyhydramnios.  #Labor: start cervical ripening with cytotec. Will place FB when able #Pain: Per patient's request #FWB: Cat I #ID:  GBS pos, start PCN #[id number]  Kandra NicolasJulie P Reema Chick 03/06/2017, 5:02 PM

## 2017-03-06 NOTE — Progress Notes (Signed)
Patient ID: Catherine Winters, female   DOB: 1996/02/21, 21 y.o.   MRN: [id number] Doing well  . Vitals:   03/06/17 1506 03/06/17 1934 03/06/17 2109  BP: 128/75 132/71 (!) 109/57  Pulse: (!) 115 92 96  Resp: 18 16   Temp: 98.3 F (36.8 C) 98.5 F (36.9 C)   TempSrc: Oral Oral   Weight: 233 lb (105.7 kg)    Height: 5\' 6"  (1.676 m)      FHR stable UCs irregular  Dilation: 3 Effacement (%): 50 Cervical Position: Middle Station: -3 Presentation: Vertex Exam by:: Ileana LaddLexie Ament, RN  Will continue to observe

## 2017-03-07 ENCOUNTER — Encounter (HOSPITAL_COMMUNITY): Payer: Self-pay

## 2017-03-07 DIAGNOSIS — O403XX0 Polyhydramnios, third trimester, not applicable or unspecified: Secondary | ICD-10-CM

## 2017-03-07 DIAGNOSIS — Z3A40 40 weeks gestation of pregnancy: Secondary | ICD-10-CM

## 2017-03-07 DIAGNOSIS — O99824 Streptococcus B carrier state complicating childbirth: Secondary | ICD-10-CM

## 2017-03-07 LAB — RPR: RPR Ser Ql: NONREACTIVE

## 2017-03-07 MED ORDER — ACETAMINOPHEN 325 MG PO TABS
650.0000 mg | ORAL_TABLET | ORAL | Status: DC | PRN
Start: 2017-03-07 — End: 2017-03-09

## 2017-03-07 MED ORDER — PHENYLEPHRINE 40 MCG/ML (10ML) SYRINGE FOR IV PUSH (FOR BLOOD PRESSURE SUPPORT)
80.0000 ug | PREFILLED_SYRINGE | INTRAVENOUS | Status: DC | PRN
Start: 2017-03-07 — End: 2017-03-07

## 2017-03-07 MED ORDER — SIMETHICONE 80 MG PO CHEW: 80.0000 mg | CHEWABLE_TABLET | ORAL | Status: DC | PRN

## 2017-03-07 MED ORDER — ONDANSETRON HCL 4 MG PO TABS: 4.0000 mg | ORAL_TABLET | ORAL | Status: DC | PRN

## 2017-03-07 MED ORDER — BENZOCAINE-MENTHOL 20-0.5 % EX AERO
1.0000 "application " | INHALATION_SPRAY | CUTANEOUS | Status: DC | PRN
  Administered 2017-03-08: 1 via TOPICAL
  Filled 2017-03-07: qty 56

## 2017-03-07 MED ORDER — TETANUS-DIPHTH-ACELL PERTUSSIS 5-2.5-18.5 LF-MCG/0.5 IM SUSP
0.5000 mL | Freq: Once | INTRAMUSCULAR | Status: DC
Start: 2017-03-08 — End: 2017-03-09

## 2017-03-07 MED ORDER — SENNOSIDES-DOCUSATE SODIUM 8.6-50 MG PO TABS
2.0000 | ORAL_TABLET | ORAL | Status: DC
  Administered 2017-03-07 – 2017-03-08 (×2): 2 via ORAL
  Filled 2017-03-07 (×2): qty 2

## 2017-03-07 MED ORDER — ONDANSETRON HCL 4 MG/2ML IJ SOLN: 4.0000 mg | INTRAMUSCULAR | Status: DC | PRN

## 2017-03-07 MED ORDER — ZOLPIDEM TARTRATE 5 MG PO TABS: 5.0000 mg | ORAL_TABLET | Freq: Every evening | ORAL | Status: DC | PRN

## 2017-03-07 MED ORDER — LIDOCAINE HCL (PF) 1 % IJ SOLN
INTRAMUSCULAR | Status: DC | PRN
  Administered 2017-03-06 (×2): 5 mL via EPIDURAL

## 2017-03-07 MED ORDER — EPHEDRINE 5 MG/ML INJ
10.0000 mg | INTRAVENOUS | Status: DC | PRN
Start: 2017-03-07 — End: 2017-03-07

## 2017-03-07 MED ORDER — COCONUT OIL OIL: 1.0000 "application " | TOPICAL_OIL | Status: DC | PRN

## 2017-03-07 MED ORDER — LACTATED RINGERS IV SOLN: 500.0000 mL | Freq: Once | INTRAVENOUS | Status: DC

## 2017-03-07 MED ORDER — DIPHENHYDRAMINE HCL 25 MG PO CAPS: 25.0000 mg | ORAL_CAPSULE | Freq: Four times a day (QID) | ORAL | Status: DC | PRN

## 2017-03-07 MED ORDER — IBUPROFEN 600 MG PO TABS
600.0000 mg | ORAL_TABLET | Freq: Four times a day (QID) | ORAL | Status: DC
  Administered 2017-03-07 – 2017-03-09 (×10): 600 mg via ORAL
  Filled 2017-03-07 (×10): qty 1

## 2017-03-07 MED ORDER — EPHEDRINE 5 MG/ML INJ: 10.0000 mg | INTRAVENOUS | Status: DC | PRN

## 2017-03-07 MED ORDER — PHENYLEPHRINE 40 MCG/ML (10ML) SYRINGE FOR IV PUSH (FOR BLOOD PRESSURE SUPPORT): 80.0000 ug | PREFILLED_SYRINGE | INTRAVENOUS | Status: DC | PRN

## 2017-03-07 MED ORDER — PRENATAL MULTIVITAMIN CH
1.0000 | ORAL_TABLET | Freq: Every day | ORAL | Status: DC
  Administered 2017-03-07 – 2017-03-09 (×3): 1 via ORAL
  Filled 2017-03-07 (×3): qty 1

## 2017-03-07 MED ORDER — DIPHENHYDRAMINE HCL 50 MG/ML IJ SOLN: 12.5000 mg | INTRAMUSCULAR | Status: DC | PRN

## 2017-03-07 MED ORDER — DIBUCAINE 1 % RE OINT: 1.0000 "application " | TOPICAL_OINTMENT | RECTAL | Status: DC | PRN

## 2017-03-07 MED ORDER — WITCH HAZEL-GLYCERIN EX PADS: 1.0000 "application " | MEDICATED_PAD | CUTANEOUS | Status: DC | PRN

## 2017-03-07 NOTE — Lactation Note (Signed)
This note was copied from a baby's chart. Lactation Consultation Note  Patient Name: Catherine Winters Reason for consult: Initial assessment;Term  Baby is 10 hours old  LC reviewed doc flow sheets , HNV, Lt MSF at birth  Pecola LeisureBaby has breast x5 10 -30 mins. And this feeding.  Baby latched easily and LC showed mom how to do the cross cradle  Ans switching arms to cradle position. Per mom comfortable, swallows  Noted. Baby stuffy , but able to stay latched.  Mother informed of post-discharge support and given phone number to the lactation department, including services for phone call assistance; out-patient appointments; and breastfeeding support group. List of other breastfeeding resources in the community given in the handout. Encouraged mother to call for problems or concerns related to breastfeeding.Mother informed of post-discharge support and given phone number to the lactation department, including services for phone call assistance; out-patient appointments; and breastfeeding support group. List of other breastfeeding resources in the community given in the handout. Encouraged mother to call for problems or concerns related to breastfeeding.   Maternal Data Has patient been taught Hand Expression?: Yes(several large drops ) Does the patient have breastfeeding experience prior to this delivery?: Yes  Feeding Feeding Type: Breast Fed Length of feed: (several swallows noted )  LATCH Score Latch: Grasps breast easily, tongue down, lips flanged, rhythmical sucking.  Audible Swallowing: Spontaneous and intermittent  Type of Nipple: Everted at rest and after stimulation  Comfort (Breast/Nipple): Soft / non-tender  Hold (Positioning): Assistance needed to correctly position infant at breast and maintain latch.  LATCH Score: 9  Interventions Interventions: Breast feeding basics reviewed;Skin to skin;Assisted with latch;Breast massage;Hand express;Breast  compression;Adjust position;Support pillows;Position options;Expressed milk  Lactation Tools Discussed/Used WIC Program: Yes   Consult Status Consult Status: Follow-up Date: 03/08/17 Follow-up type: In-patient    Catherine Winters Winters, 2:28 PM

## 2017-03-07 NOTE — Progress Notes (Signed)
Patient ID: Catherine Winters, female   DOB: December 24, 1995, 21 y.o.   MRN: [id number] Vitals:   03/06/17 2335 03/06/17 2340 03/06/17 2346 03/07/17 0006  BP: 121/65 117/64 121/75 118/65  Pulse: 79 83 93 74  Resp:      Temp:      TempSrc:      SpO2: 100% 100%    Weight:      Height:       FHR reassuring, 10 beat accels UCs not tracing consistently, appear to be q3-4 min  Dilation: 6 Effacement (%): 60 Cervical Position: Middle Station: -2 Presentation: Vertex Exam by:: Ileana LaddLexie Ament, RN  Continue to observe

## 2017-03-07 NOTE — Plan of Care (Signed)
  Activity: Risk for activity intolerance will decrease 03/07/2017 0953 by Karn Cassissborne, Zyara Riling H, RN Note Assisted patient to the bathroom and demonstrated and explained peri care. Patient voided well independently and complains of no dizziness or other problems.

## 2017-03-07 NOTE — Anesthesia Procedure Notes (Signed)
Epidural Patient location during procedure: OB Start time: 03/06/2017 11:10 PM End time: 03/06/2017 11:15 PM  Staffing Anesthesiologist: Bethena Midgetddono, Berish Bohman, MD  Preanesthetic Checklist Completed: patient identified, site marked, surgical consent, pre-op evaluation, timeout performed, IV checked, risks and benefits discussed and monitors and equipment checked  Epidural Patient position: sitting Prep: site prepped and draped and DuraPrep Patient monitoring: continuous pulse ox and blood pressure Approach: midline Location: L4-L5 Injection technique: LOR air  Needle:  Needle type: Tuohy  Needle gauge: 17 G Needle length: 9 cm and 9 Needle insertion depth: 6 cm Catheter type: closed end flexible Catheter size: 19 Gauge Catheter at skin depth: 11 cm Test dose: negative  Assessment Events: blood not aspirated, injection not painful, no injection resistance, negative IV test and no paresthesia

## 2017-03-07 NOTE — Anesthesia Preprocedure Evaluation (Signed)

## 2017-03-07 NOTE — Anesthesia Postprocedure Evaluation (Signed)
Anesthesia Post Note  Patient: Catherine Winters  Procedure(s) Performed: AN AD HOC LABOR EPIDURAL     Patient location during evaluation: Mother Baby Anesthesia Type: Epidural Level of consciousness: awake Pain management: satisfactory to patient Vital Signs Assessment: post-procedure vital signs reviewed and stable Respiratory status: spontaneous breathing Cardiovascular status: stable Anesthetic complications: no    Last Vitals:  Vitals:   03/07/17 0730 03/07/17 1150  BP: (!) 120/57 129/63  Pulse: 76 100  Resp: 16 18  Temp: 36.8 C 37.4 C  SpO2: 97% 98%    Last Pain:  Vitals:   03/07/17 1150  TempSrc: Oral  PainSc: 4    Pain Goal: Patients Stated Pain Goal: 2 (03/07/17 1150)               Cephus ShellingBURGER,Darice Vicario

## 2017-03-08 MED ORDER — ZOLPIDEM TARTRATE 5 MG PO TABS: 5.0000 mg | ORAL_TABLET | Freq: Every evening | ORAL | Status: DC | PRN

## 2017-03-08 MED ORDER — FENTANYL 2.5 MCG/ML BUPIVACAINE 1/10 % EPIDURAL INFUSION (WH - ANES)
INTRAMUSCULAR | Status: DC | PRN
  Administered 2017-03-06: 14 mL/h via EPIDURAL

## 2017-03-08 NOTE — Lactation Note (Signed)
This note was copied from a baby's chart. Lactation Consultation Note: LC arrived and mother reports that infant is cluster feeding.  Observed infant with shallow latch. Mother taught to flange infants lips for wider gape. Observed that mothers nipple pinched when infant released the breast.   Assist mother with latching infant on using the cross cradle hold. Infant latched well with good depth. Advised mother to use good breast support and assist with additional pillow support.   Encouraged mother to rotate positions. Mother was given a harmony hand pump with a #27 flange. Mother advised in use. [facility] and she has an electric pump at home.   Discussed cluster feeding and advised to continue to feed infant at least 8-12 times in 24 hours. Mother reports that she would like another LC visit in am before discharge.  Patient Name: Catherine Winters ZOXWR'UToday's Date: 03/08/2017 Reason for consult: Follow-up assessment   Maternal Data    Feeding Feeding Type: Breast Fed Length of feed: 15 min  LATCH Score Latch: Grasps breast easily, tongue down, lips flanged, rhythmical sucking.  Audible Swallowing: Spontaneous and intermittent  Type of Nipple: Everted at rest and after stimulation  Comfort (Breast/Nipple): Soft / non-tender  Hold (Positioning): Assistance needed to correctly position infant at breast and maintain latch.  LATCH Score: 9  Interventions    Lactation Tools Discussed/Used     Consult Status Consult Status: Follow-up Date: 03/09/17 Follow-up type: In-patient    Stevan BornKendrick, Verdell [facility]annon [facility] 03/08/2017, 2:18 PM

## 2017-03-08 NOTE — Progress Notes (Signed)
POSTPARTUM PROGRESS NOTE  Post Partum Day 1 Subjective:  Catherine Winters is a 21 y.o. [id number] 43109w5d s/p SVD.  No acute events overnight.  Pt denies problems with ambulating, voiding or po intake.  She denies nausea or vomiting.  Pain is well controlled.  She has had flatus. She has not had bowel movement.  Lochia Minimal.   Objective: Blood pressure 125/76, pulse 91, temperature 98 F (36.7 C), temperature source Oral, resp. rate 19, height 5\' 6"  (1.676 m), weight 233 lb (105.7 kg), last menstrual period 05/26/2016, SpO2 98 %, unknown if currently breastfeeding.  Physical Exam:  General: alert, cooperative and no distress Lochia:normal flow Chest: no respiratory distress Heart:regular rate, distal pulses intact Abdomen: soft, nontender,  Uterine Fundus: firm, appropriately tender DVT Evaluation: No calf swelling or tenderness Extremities: no notable edema  Recent Labs    03/06/17 1502  HGB 10.2*  HCT 31.9*    Assessment/Plan:  ASSESSMENT: Catherine Winters is a 21 y.o. A5W0981G3P2012 34109w5d s/p SVD  Plan for discharge tomorrow   LOS: 2 days   Carleena Mires BlandDO 03/08/2017, 12:19 PM

## 2017-03-08 NOTE — Plan of Care (Signed)
Progressing appropriately.

## 2017-03-08 NOTE — Addendum Note (Signed)
Addendum  created 03/08/17 1352 by Bethena Midgetddono, Anaja Monts, MD   Intraprocedure Event edited, Intraprocedure Staff edited

## 2017-03-09 ENCOUNTER — Ambulatory Visit: Payer: Self-pay

## 2017-03-09 ENCOUNTER — Other Ambulatory Visit: Payer: Self-pay

## 2017-03-09 MED ORDER — IBUPROFEN 600 MG PO TABS: 600.0000 mg | ORAL_TABLET | Freq: Four times a day (QID) | ORAL | 0 refills | Status: DC

## 2017-03-09 NOTE — Lactation Note (Signed)
This note was copied from a baby's chart. Lactation Consultation Note: Mother reports that infant had a stool. Mother breastfeeding infant when I arrived in room. Observed infant with frequent suckling and swallows. Mothers breast are filling. She was advised to do good breast massage and continue to breastfeed infant on cue. Advised mother in treatment and prevention of engorgement. Mother denies having any breastfeeding concerns. Mother informed of all LC services, BFS[facility] and outpatient dept., and phone number to call with any breastfeeding concerns or questions.   Patient Name: Catherine Winters ZOXWR'UToday's Date: 03/09/2017 Reason for consult: Follow-up assessment   Maternal Data    Feeding Feeding Type: Breast Fed  LATCH Score Latch: Grasps breast easily, tongue down, lips flanged, rhythmical sucking.  Audible Swallowing: Spontaneous and intermittent  Type of Nipple: Everted at rest and after stimulation  Comfort (Breast/Nipple): Filling, red/small blisters or bruises, mild/mod discomfort  Hold (Positioning): No assistance needed to correctly position infant at breast.  LATCH Score: 9  Interventions    Lactation Tools Discussed/Used     Consult Status Consult Status: Complete    Michel BickersKendrick, Kenyatte Gruber McCoy 03/09/2017, 3:38 PM

## 2017-03-09 NOTE — Discharge Instructions (Signed)

## 2017-03-09 NOTE — Discharge Summary (Signed)
OB Discharge Summary     Patient Name: Catherine Winters DOB: 19-Feb-1996 MRN: [id number] Date of admission: 03/06/2017  Delivering MD: Lovena NeighboursIALLO, ABDOULAYE )  Date of discharge: 03/09/2017    Admitting diagnosis: polyhydramnios,pregnancy at 40 weeks completed gestation Intrauterine pregnancy: 537w5d    Secondary diagnosis:  Active Problems:   Patient Active Problem List   Diagnosis Date Noted  . Post term pregnancy at [redacted] weeks gestation 03/06/2017    Additional problems: none     Discharge diagnosis: Term Pregnancy Delivered                                                                                                Post partum procedures:none  Complications: None  Hospital course:  Induction of Labor With Vaginal Delivery   21 y.o. yo [id number] at 447w5d was admitted to the hospital 03/06/2017 for induction of labor.  Indication for induction: polyhydramnios.  Patient had an uncomplicated labor course as follows: Membrane Rupture Time/Date: 2:10 AM ,03/07/2017   Intrapartum Procedures: Episiotomy:                                           Lacerations:  Perineal [11];2nd degree [3]  Patient had delivery of a Viable infant.  Information for the patient's newborn:  Jenny Reichmannmusa, Girl Jamielee [[id number]][[id number]]      03/07/2017  Details of delivery can be found in separate delivery note.  Patient had a routine postpartum course. Patient is discharged home 03/09/17.  Physical exam  Vitals:   03/08/17 1900 03/09/17 0500  BP: 119/63 125/74  Pulse: 93 80  Resp: 18 18  Temp: 98.4 F (36.9 C) 98.7 F (37.1 C)  SpO2:      General: alert, cooperative and no distress Lochia: appropriate Uterine Fundus: firm Incision: N/A DVT Evaluation: No evidence of DVT seen on physical exam.  Labs: No results found for this or any previous visit (from the past 24 hour(s)).   Discharge instruction: per After Visit Summary and "Baby and Me Booklet".  After visit meds:  No Known Allergies  Allergies  as of 03/09/2017   No Known Allergies     Medication List    TAKE these medications   acetaminophen 500 MG tablet Commonly known as:  TYLENOL Take 500 mg by mouth every 6 (six) hours as needed for moderate pain or headache.   ferrous sulfate 325 (65 FE) MG tablet Take 325 mg by mouth 2 (two) times daily with a meal.   ibuprofen 600 MG tablet Commonly known as:  ADVIL,MOTRIN Take 1 tablet (600 mg total) by mouth every 6 (six) hours. What changed:    when to take this  reasons to take this   multivitamin-prenatal 27-0.8 MG Tabs tablet Take 1 tablet by mouth daily at 12 noon.        Diet: routine diet  Activity: Advance as tolerated. Pelvic rest for 6 weeks.   Outpatient follow up:4 weeks Future Appointments: No future appointments.  Follow up Appt: No Follow-up on  file.     Postpartum contraception: IUD  Newborn Data: APGAR (1 MIN): 8   APGAR (5 MINS): 9      Baby Feeding: Breast Disposition:home with mother  Rolm Bookbindermber Paije Goodhart, DO  03/09/2017

## 2017-03-10 ENCOUNTER — Inpatient Hospital Stay (HOSPITAL_COMMUNITY): Admission: RE | Admit: 2017-03-10 | Payer: Medicaid Other | Source: Ambulatory Visit

## 2018-04-16 NOTE — L&D Delivery Note (Signed)
OB/GYN Faculty Practice Delivery Note  Catherine Winters is a 23 y.o. [id number] s/p SVD at [redacted]w[redacted]d. She was admitted for induction of labor for post-dates.   ROM: 4h 32m with meconium-stained fluid GBS Status: negative Maximum Maternal Temperature: Temp (24hrs), Avg:98.7 F (37.1 C), Min:98.6 F (37 C), Max:98.9 F (37.2 C)  Labor Progress: . Induction started with pitocin on admission, cervix favorable . Epidural placement . SROM  . Progressed to complete and short period of pushing  Delivery Date/Time: 10/02/18 at 2223 Delivery: Called to room and patient was complete and pushing. Head delivered ROA. No nuchal cord present. Shoulder and body delivered in usual fashion. Infant with spontaneous cry, placed on mother's abdomen, dried and stimulated. Cord clamped x 2 after 90-second delay, and cut by father of baby. Cord blood drawn. Placenta delivered spontaneously with gentle cord traction. Fundus firm with massage and Pitocin. Labia, perineum, vagina, and cervix inspected inspected with 1st degree perineal laceration.   Placenta: spontaneous, intact, 3-vessel cord (to be discarded) Complications: meconium-stained fluid Lacerations: 1st degree (repaired with 4-0 monocryl) EBL: 651cc per triton Analgesia: epidural  Postpartum Planning [HD] message to sent to schedule follow-up  [/] vaccines UTD  Infant: Vigorous female  APGARs 9, 9  weight pending but appears AGA   [name] S. Juleen China, DO OB/GYN Fellow, Faculty Practice

## 2018-05-01 LAB — OB RESULTS CONSOLE RPR: RPR: NONREACTIVE

## 2018-05-01 LAB — OB RESULTS CONSOLE HEPATITIS B SURFACE ANTIGEN: Hepatitis B Surface Ag: NEGATIVE

## 2018-05-01 LAB — OB RESULTS CONSOLE ANTIBODY SCREEN: Antibody Screen: NEGATIVE

## 2018-05-01 LAB — OB RESULTS CONSOLE GC/CHLAMYDIA
Chlamydia: NEGATIVE
Gonorrhea: NEGATIVE

## 2018-05-01 LAB — OB RESULTS CONSOLE HIV ANTIBODY (ROUTINE TESTING): HIV: NONREACTIVE

## 2018-05-01 LAB — OB RESULTS CONSOLE RUBELLA ANTIBODY, IGM: Rubella: IMMUNE

## 2018-05-01 LAB — OB RESULTS CONSOLE ABO/RH: RH Type: POSITIVE

## 2018-06-13 IMAGING — US US MFM FETAL BPP W/O NON-STRESS
1 series · 15 of 25 positions shown · non-contrast
Comparison: none

[Series 1: us mfm fetal bpp w/o non-stress · 25 acquisitions, 15 frames shown]
[im 1/25]
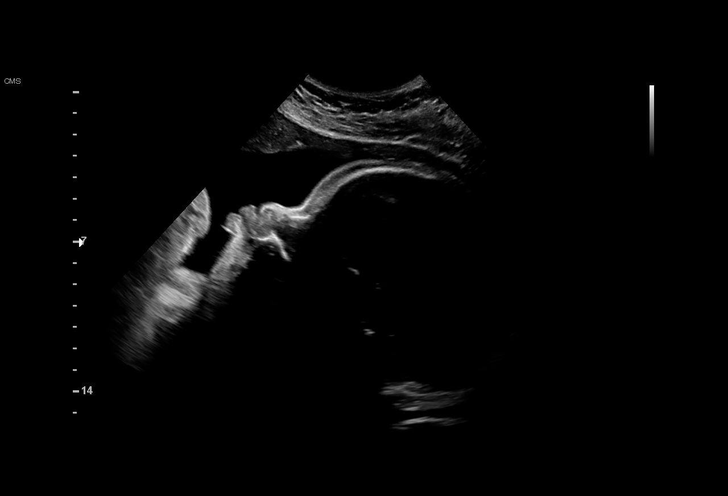
[im 3/25]
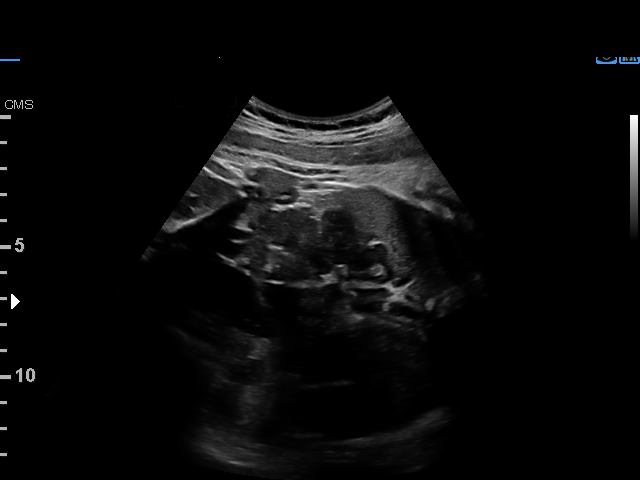
[im 5/25]
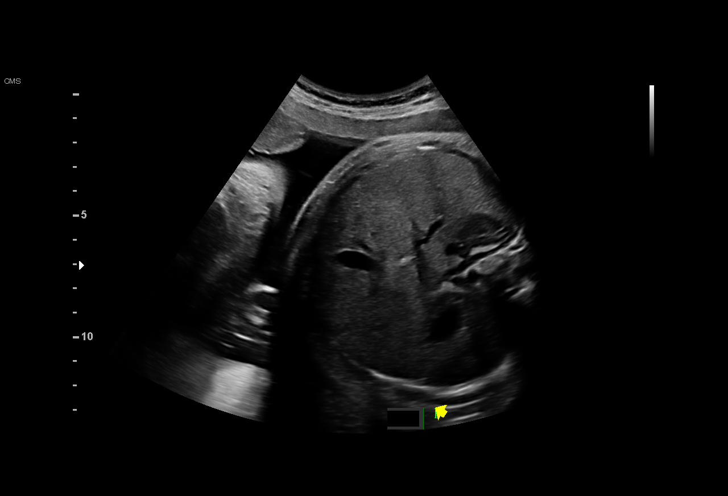
[im 6/25]
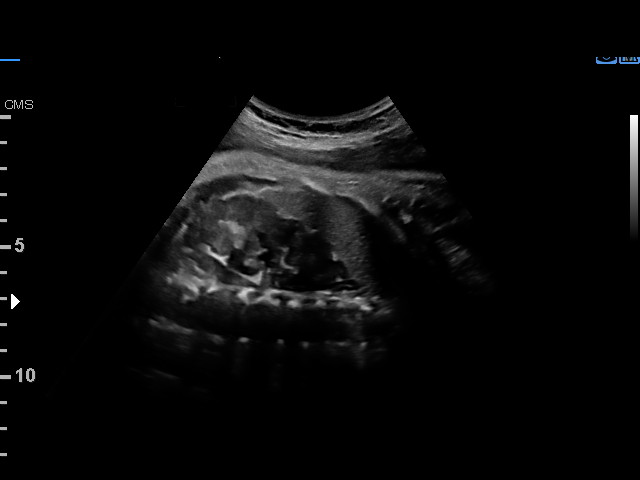
[im 8/25]
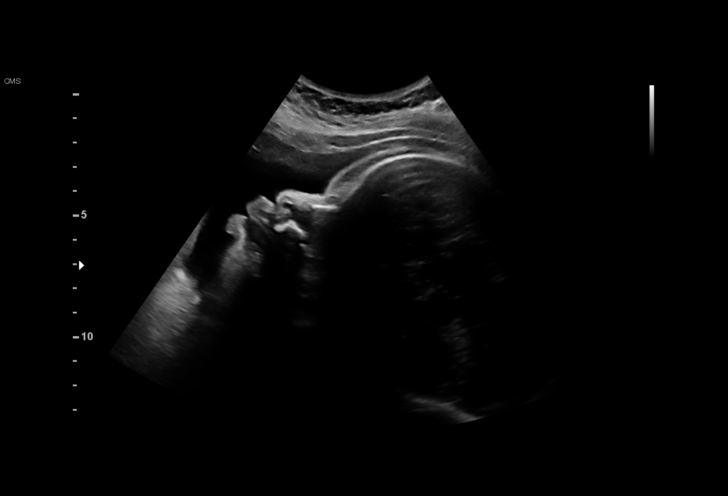
[im 10/25]
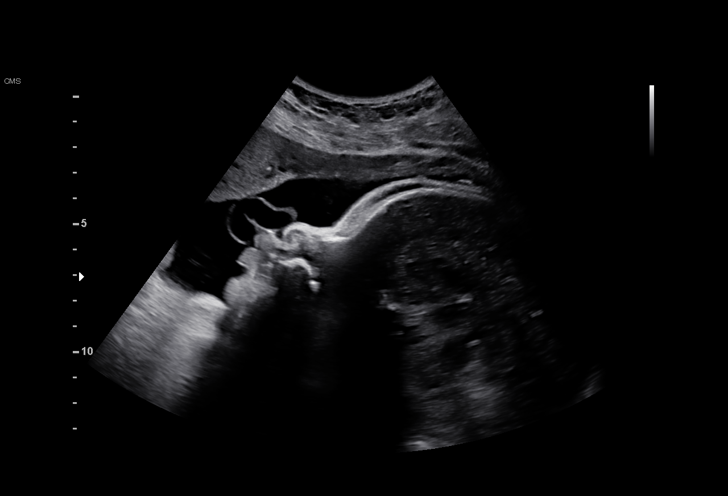
[im 11/25]
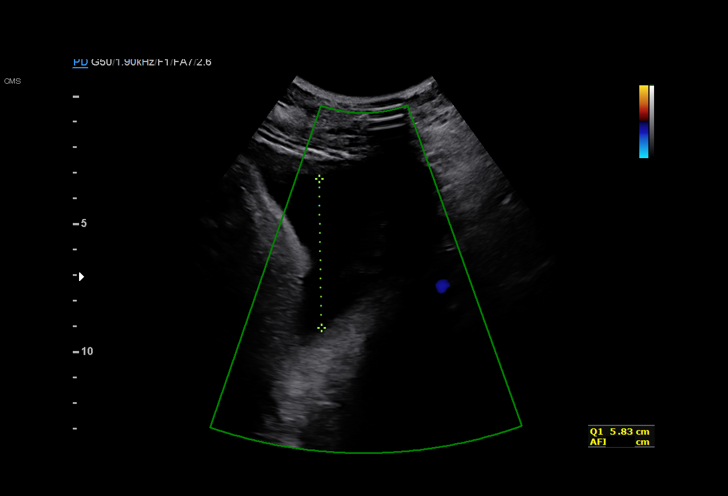
[im 13/25]
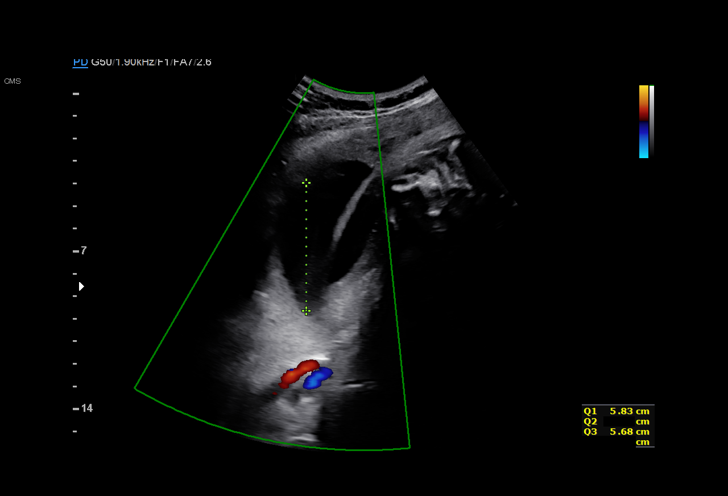
[im 15/25]
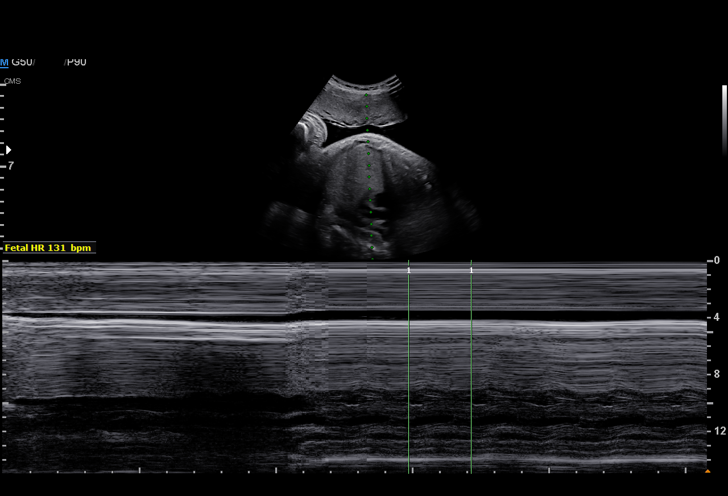
[im 16/25]
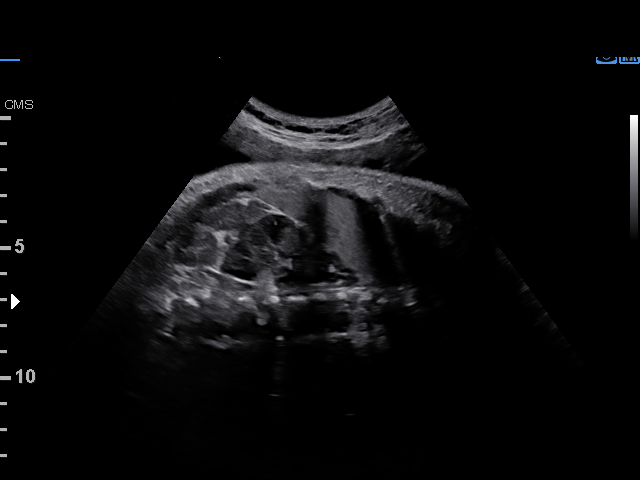
[im 18/25]
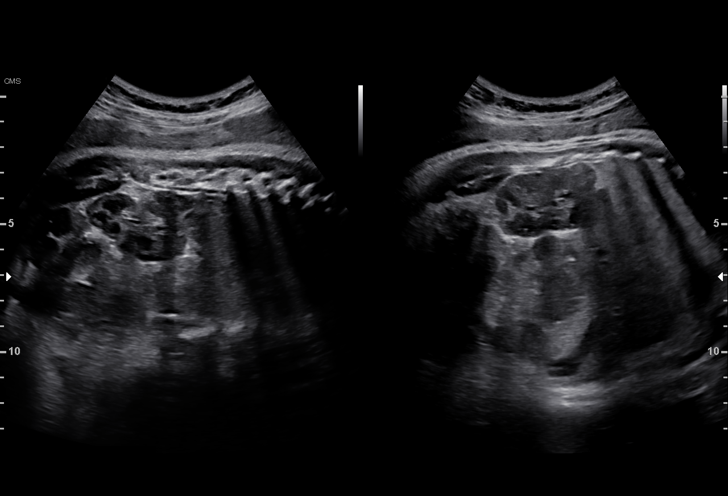
[im 20/25]
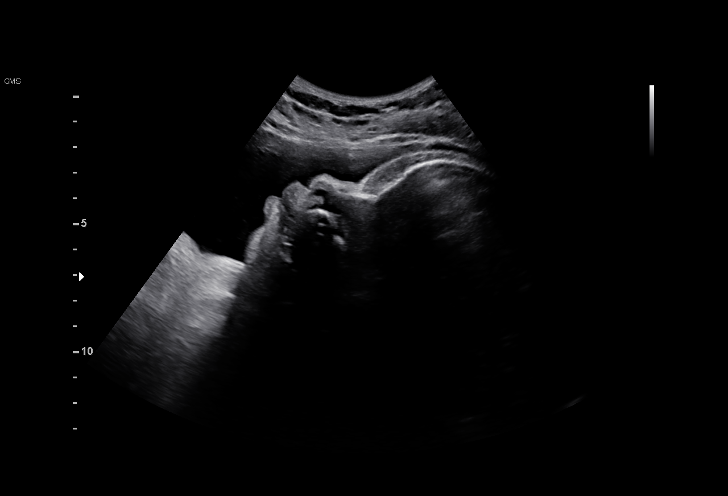
[im 21/25]
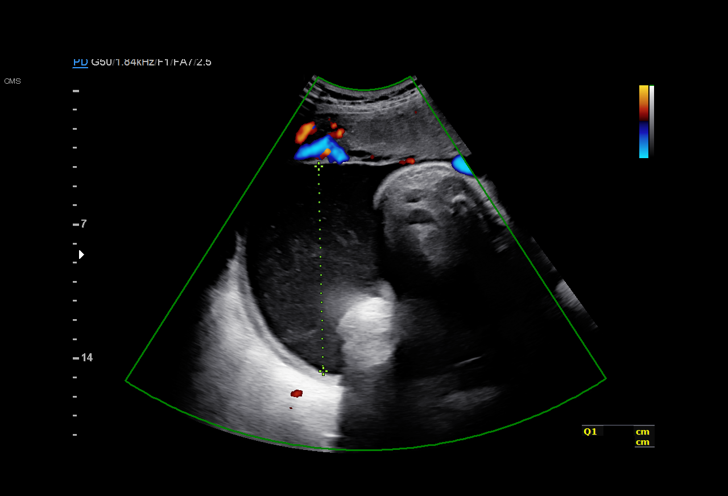
[im 23/25]
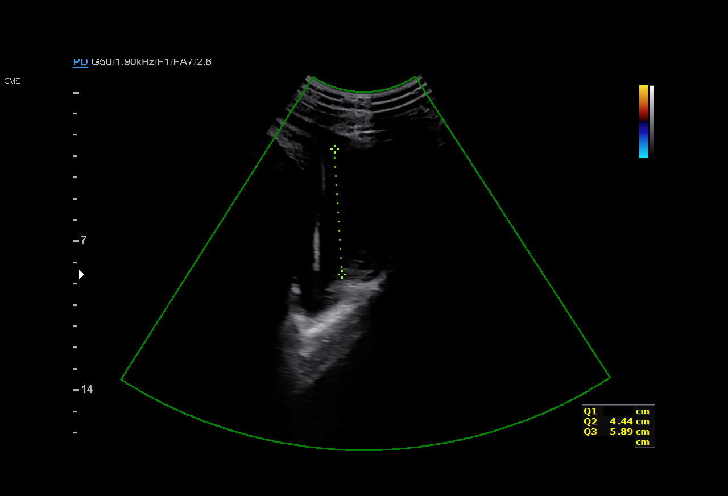
[im 25/25]
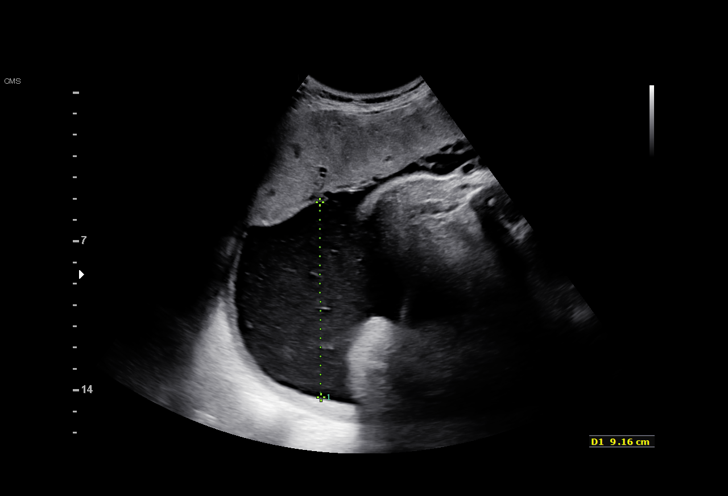

[15 of 25 positions shown; findings below may reference images not displayed]

[REDACTED]-
Faculty Physician

1  KINYA DANUSHKA           [phone]      [phone]     [phone]
Indications

40 weeks gestation of pregnancy
Postdate pregnancy (40-42 weeks)
OB History

Gravidity:    3         Term:   1        Prem:   0        SAB:   1
TOP:          0       Ectopic:  0        Living: 1
Fetal Evaluation

Num Of Fetuses:     1
Fetal Heart         131
Rate(bpm):
Cardiac Activity:   Observed
Presentation:       Cephalic

Amniotic Fluid
AFI FV:      Polyhydramnios

AFI Sum(cm)     %Tile       Largest Pocket(cm)
29.29           > 97

RUQ(cm)       RLQ(cm)       LUQ(cm)        LLQ(cm)
10.7
Biophysical Evaluation

Amniotic F.V:   Polyhydramnios             F. Tone:        Observed
F. Movement:    Observed                   Score:          [DATE]
F. Breathing:   Observed
Gestational Age

LMP:           40w 4d        Date:  05/26/16                 EDD:   03/02/17
Best:          40w 4d     Det. By:  LMP  (05/26/16)          EDD:   03/02/17
Impression

SIUP at 40+4 weeks
Cephalic presentation
MIld polyhydramnios
BPP [DATE]
Recommendations

Induciton of labor

## 2018-08-28 LAB — OB RESULTS CONSOLE GBS: GBS: NEGATIVE

## 2018-09-26 ENCOUNTER — Encounter (HOSPITAL_COMMUNITY): Payer: Self-pay | Admitting: *Deleted

## 2018-09-26 ENCOUNTER — Telehealth (HOSPITAL_COMMUNITY): Payer: Self-pay | Admitting: *Deleted

## 2018-09-26 ENCOUNTER — Other Ambulatory Visit: Payer: Self-pay | Admitting: Advanced Practice Midwife

## 2018-09-26 NOTE — Telephone Encounter (Signed)
Preadmission screen  

## 2018-09-30 ENCOUNTER — Other Ambulatory Visit: Payer: Self-pay

## 2018-09-30 ENCOUNTER — Ambulatory Visit (HOSPITAL_COMMUNITY)
Admission: RE | Admit: 2018-09-30 | Discharge: 2018-09-30 | Disposition: A | Discharge status: Home / Self Care | Payer: Medicaid Other | Source: Ambulatory Visit | Attending: Obstetrics and Gynecology | Admitting: Obstetrics and Gynecology

## 2018-09-30 DIAGNOSIS — Z1159 Encounter for screening for other viral diseases: Secondary | ICD-10-CM | POA: Diagnosis present

## 2018-09-30 NOTE — MAU Note (Signed)
Asymptomatic swab collected without problem.

## 2018-10-01 ENCOUNTER — Other Ambulatory Visit (HOSPITAL_COMMUNITY): Payer: Self-pay | Admitting: *Deleted

## 2018-10-01 ENCOUNTER — Other Ambulatory Visit: Payer: Self-pay | Admitting: Advanced Practice Midwife

## 2018-10-01 LAB — NOVEL CORONAVIRUS, NAA (HOSP ORDER, SEND-OUT TO REF LAB; TAT 18-24 HRS): SARS-CoV-2, NAA: NOT DETECTED

## 2018-10-02 ENCOUNTER — Encounter (HOSPITAL_COMMUNITY): Payer: Self-pay | Admitting: *Deleted

## 2018-10-02 ENCOUNTER — Other Ambulatory Visit: Payer: Self-pay

## 2018-10-02 ENCOUNTER — Inpatient Hospital Stay (HOSPITAL_COMMUNITY)
Admission: AD | Admit: 2018-10-02 | Discharge: 2018-10-04 | DRG: 807 | Disposition: A | Discharge status: Home / Self Care | Payer: Medicaid Other | Attending: Family Medicine | Admitting: Family Medicine

## 2018-10-02 ENCOUNTER — Inpatient Hospital Stay (HOSPITAL_COMMUNITY): Payer: Medicaid Other | Admitting: Anesthesiology

## 2018-10-02 ENCOUNTER — Inpatient Hospital Stay (HOSPITAL_COMMUNITY): Payer: Medicaid Other

## 2018-10-02 DIAGNOSIS — O9902 Anemia complicating childbirth: Secondary | ICD-10-CM | POA: Diagnosis present

## 2018-10-02 DIAGNOSIS — E669 Obesity, unspecified: Secondary | ICD-10-CM | POA: Diagnosis present

## 2018-10-02 DIAGNOSIS — Z3A41 41 weeks gestation of pregnancy: Secondary | ICD-10-CM

## 2018-10-02 DIAGNOSIS — O99214 Obesity complicating childbirth: Secondary | ICD-10-CM | POA: Diagnosis present

## 2018-10-02 DIAGNOSIS — D649 Anemia, unspecified: Secondary | ICD-10-CM | POA: Diagnosis present

## 2018-10-02 DIAGNOSIS — O480 Post-term pregnancy: Principal | ICD-10-CM | POA: Diagnosis present

## 2018-10-02 LAB — TYPE AND SCREEN
ABO/RH(D): O POS
Antibody Screen: NEGATIVE

## 2018-10-02 LAB — CBC
HCT: 29.9 % — ABNORMAL LOW (ref 36.0–46.0)
Hemoglobin: 9.5 g/dL — ABNORMAL LOW (ref 12.0–15.0)
MCH: 29.7 pg (ref 26.0–34.0)
MCHC: 31.8 g/dL (ref 30.0–36.0)
MCV: 93.4 fL (ref 80.0–100.0)
Platelets: 214 10*3/uL (ref 150–400)
RBC: 3.2 MIL/uL — ABNORMAL LOW (ref 3.87–5.11)
RDW: 14.9 % (ref 11.5–15.5)
WBC: 7.3 10*3/uL (ref 4.0–10.5)
nRBC: 0 % (ref 0.0–0.2)

## 2018-10-02 MED ORDER — PHENYLEPHRINE 40 MCG/ML (10ML) SYRINGE FOR IV PUSH (FOR BLOOD PRESSURE SUPPORT)
80.0000 ug | PREFILLED_SYRINGE | INTRAVENOUS | Status: DC | PRN
  Filled 2018-10-02: qty 10

## 2018-10-02 MED ORDER — TERBUTALINE SULFATE 1 MG/ML IJ SOLN: 0.2500 mg | Freq: Once | INTRAMUSCULAR | Status: DC | PRN

## 2018-10-02 MED ORDER — LACTATED RINGERS IV SOLN
500.0000 mL | Freq: Once | INTRAVENOUS | Status: AC
  Administered 2018-10-02: 500 mL via INTRAVENOUS

## 2018-10-02 MED ORDER — OXYTOCIN 40 UNITS IN NORMAL SALINE INFUSION - SIMPLE MED: 2.5000 [IU]/h | INTRAVENOUS | Status: DC

## 2018-10-02 MED ORDER — OXYTOCIN BOLUS FROM INFUSION
500.0000 mL | Freq: Once | INTRAVENOUS | Status: AC
  Administered 2018-10-02: 500 mL via INTRAVENOUS

## 2018-10-02 MED ORDER — LIDOCAINE HCL (PF) 1 % IJ SOLN
INTRAMUSCULAR | Status: DC | PRN
  Administered 2018-10-02 (×2): 4 mL via EPIDURAL

## 2018-10-02 MED ORDER — ONDANSETRON HCL 4 MG/2ML IJ SOLN: 4.0000 mg | Freq: Four times a day (QID) | INTRAMUSCULAR | Status: DC | PRN

## 2018-10-02 MED ORDER — OXYTOCIN 40 UNITS IN NORMAL SALINE INFUSION - SIMPLE MED
1.0000 m[IU]/min | INTRAVENOUS | Status: DC
  Administered 2018-10-02: 2 m[IU]/min via INTRAVENOUS
  Filled 2018-10-02: qty 1000

## 2018-10-02 MED ORDER — PHENYLEPHRINE 40 MCG/ML (10ML) SYRINGE FOR IV PUSH (FOR BLOOD PRESSURE SUPPORT): 80.0000 ug | PREFILLED_SYRINGE | INTRAVENOUS | Status: DC | PRN

## 2018-10-02 MED ORDER — LACTATED RINGERS IV SOLN
500.0000 mL | INTRAVENOUS | Status: DC | PRN
  Administered 2018-10-02: 500 mL via INTRAVENOUS

## 2018-10-02 MED ORDER — SOD CITRATE-CITRIC ACID 500-334 MG/5ML PO SOLN: 30.0000 mL | ORAL | Status: DC | PRN

## 2018-10-02 MED ORDER — EPHEDRINE 5 MG/ML INJ: 10.0000 mg | INTRAVENOUS | Status: DC | PRN

## 2018-10-02 MED ORDER — SODIUM CHLORIDE (PF) 0.9 % IJ SOLN
INTRAMUSCULAR | Status: DC | PRN
  Administered 2018-10-02: 12 mL/h via EPIDURAL

## 2018-10-02 MED ORDER — DIPHENHYDRAMINE HCL 50 MG/ML IJ SOLN: 12.5000 mg | INTRAMUSCULAR | Status: DC | PRN

## 2018-10-02 MED ORDER — FENTANYL-BUPIVACAINE-NACL 0.5-0.125-0.9 MG/250ML-% EP SOLN
12.0000 mL/h | EPIDURAL | Status: DC | PRN
  Administered 2018-10-02: 12 mL/h via EPIDURAL
  Filled 2018-10-02: qty 250

## 2018-10-02 MED ORDER — FENTANYL CITRATE (PF) 100 MCG/2ML IJ SOLN: 100.0000 ug | INTRAMUSCULAR | Status: DC | PRN

## 2018-10-02 MED ORDER — OXYCODONE-ACETAMINOPHEN 5-325 MG PO TABS: 1.0000 | ORAL_TABLET | ORAL | Status: DC | PRN

## 2018-10-02 MED ORDER — LIDOCAINE HCL (PF) 1 % IJ SOLN: 30.0000 mL | INTRAMUSCULAR | Status: DC | PRN

## 2018-10-02 MED ORDER — OXYCODONE-ACETAMINOPHEN 5-325 MG PO TABS: 2.0000 | ORAL_TABLET | ORAL | Status: DC | PRN

## 2018-10-02 MED ORDER — ACETAMINOPHEN 325 MG PO TABS: 650.0000 mg | ORAL_TABLET | ORAL | Status: DC | PRN

## 2018-10-02 MED ORDER — LACTATED RINGERS IV SOLN
INTRAVENOUS | Status: DC
  Administered 2018-10-02 (×2): via INTRAVENOUS

## 2018-10-02 NOTE — H&P (Addendum)
Obstetric Admission History & Physical  Subjective Catherine Winters is a 23 y.o. female [phone] with IUP at [redacted]w[redacted]d by LMP admitted for induction of labor due to post dates.  Reports fetal movement. Denies vaginal bleeding and ROM. She received her prenatal care at [place] person in labor: FOB  Ultrasounds . 20+0 per [facility] records   Prenatal History/Complications: . Obesity . Anemia  Past Medical History:  Diagnosis Date  . Anemia   . Medical history non-contributory    Past Surgical History:  Procedure Laterality Date  . WISDOM TOOTH EXTRACTION     OB History    Gravida  4   Para  2   Term  2   Preterm  0   AB  1   Living  2     SAB  1   TAB  0   Ectopic  0   Multiple  0   Live Births  2          Social History   Socioeconomic History  . Marital status: Married    Spouse name: Not on file  . Number of children: Not on file  . Years of education: Not on file  . Highest education level: Not on file  Occupational History  . Not on file  Social Needs  . Financial resource strain: Not hard at all  . Food insecurity    Worry: Never true    Inability: Never true  . Transportation needs    Medical: No    Non-medical: Not on file  Tobacco Use  . Smoking status: Never Smoker  . Smokeless tobacco: Never Used  Substance and Sexual Activity  . Alcohol use: No  . Drug use: No  . Sexual activity: Not Currently  Lifestyle  . Physical activity    Days per week: Not on file    Minutes per session: Not on file  . Stress: Not at all  Relationships  . Social Herbalist on phone: Not on file    Gets together: Not on file    Attends religious service: Not on file    Active member of club or organization: Not on file    Attends meetings of clubs or organizations: Not on file    Relationship status: Not on file  Other Topics Concern  . Not on file  Social History Narrative  . Not on file   History reviewed. No pertinent family  history. Allergies: No Known Allergies Medications:  Current Meds  Medication Sig  . ferrous sulfate 325 (65 FE) MG tablet Take 325 mg by mouth 2 (two) times daily with a meal.  . Prenatal Vit-Fe Fumarate-FA (MULTIVITAMIN-PRENATAL) 27-0.8 MG TABS tablet Take 1 tablet by mouth daily at 12 noon.    Review of Systems  All systems reviewed and negative except as stated in HPI  Objective Physical Exam:  BP 121/74 (BP Location: Left Arm)   Pulse (!) 104   Temp 98.6 F (37 C) (Oral)   Resp 18   Ht 5' 6.5" (1.689 m)   Wt 106.6 kg   SpO2 100%   BMI 37.36 kg/m  General appearance: alert, cooperative, appears stated age and no distress Lungs: no respiratory distress Heart: RRR. No BLEE.  Abdomen: soft, non-tender; gravid  Pelvic: as below  Extremities: Moving spontaneously, warm, well perfused. 2+ DP. Uterine activity: irregular contractions  Cervical Exam: 3cm, 70%, -3   Presentation: cephalic Fetal monitoring: baseline 150 / mod variability/ +accel /  variable decel  Prenatal labs: ABO, Rh: O/Positive/-- (01/16 0000) Antibody: Negative (01/16 0000) Rubella: Immune (01/16 0000) RPR: Nonreactive (01/16 0000)  HBsAg: Negative (01/16 0000)  HIV: Non-reactive (01/16 0000)  GBS:   Negative  Glucola: 1 hour - 98 Genetic screening:  none  Prenatal Transfer Tool  Maternal Diabetes: No Genetic Screening: Normal Maternal Ultrasounds/Referrals: Normal Fetal Ultrasounds or other Referrals:  None Maternal Substance Abuse:  No Significant Maternal Medications:  None Significant Maternal Lab Results: Group B Strep negative  No results found for this or any previous visit (from the past 24 hour(s)).  Patient Active Problem List   Diagnosis Date Noted  . Post term pregnancy at [redacted] weeks gestation 03/06/2017    Assessment & Plan:  Catherine Winters is a 23 y.o. [id number] at [redacted]w[redacted]d - IOL for postdates   Labor: Favorable cervix - starting pitocin . Pain control: Epidural and IV pain  meds . Anticipated MOD: NSVD . PPH risk: low  Fetal Wellbeing: EFW 7lb by leopolds. Cephalic by CE.  Category II tracing. . GBS:   negative . Continuous fetal monitoring  Postpartum Planning . Girl/breast/undecided . R immune, Tdap [x]    Genia Hotterachel Kim, M.D.  Family Medicine  PGY-1 10/02/2018 8:30 AM  Midwife attestation: I have seen and examined this patient; I agree with above documentation in the resident's note.   PE: Gen: calm comfortable, NAD Resp: normal effort and rate Abd: gravid  ROS, labs, PMH reviewed  Assessment/Plan: Catherine Winters is a 23 y.o. [phone] here for IOL for Postdates Admit to LD Labor: latent FWB: Cat II GBS neg Start Pitocin, anticipate SVD  Catherine Winters, CNM  10/02/2018, 9:53 AM

## 2018-10-02 NOTE — Progress Notes (Signed)
OB/GYN Faculty Practice: Labor Progress Note  Subjective: Into room to introduce self to patient and partner. Sign-out received from Dr. Maudie Mercury around 2000. Perineal pressure is improved, comfortable on side.   Objective: BP (!) 107/52   Pulse 81   Temp 98.6 F (37 C) (Oral)   Resp 16   Ht 5' 6.5" (1.689 m)   Wt 106.6 kg   SpO2 100%   BMI 37.36 kg/m  Gen: well-appearing, NAD Dilation: 8.5 Effacement (%): 70 Cervical Position: Middle Station: 0 Presentation: Vertex Exam by:: Dr. Maudie Mercury  Assessment and Plan: 23 y.o. [id number] [redacted]w[redacted]d here for PDIOL.   Labor: Induction started this morning with pitocin. SROM around 1800. Pitocin was decreased for frequent contractions from 16 to 8 and now around 4 still with regular contractions. Plan to recheck SVE in about 30 minutes and will hopefully be complete. Anticipate SVD.  -- pain control: epidural -- PPH Risk: low  Fetal Well-Being: EFW 8lbs by Leopolds. Cephalic by sutures on prior checks.  -- Category I - continuous fetal monitoring  -- GBS negative    Catherine Winters S. Juleen China, DO OB/GYN Fellow, Faculty Practice  9:35 PM

## 2018-10-02 NOTE — Discharge Summary (Signed)
Obstetrics Discharge Summary OB/GYN Faculty Practice   Patient Name: Catherine Winters DOB: 1995-12-10 MRN: [id number]  Date of admission: 10/02/2018 Delivering MD: Glenice Bow   Date of discharge: 10/04/2018  Admitting diagnosis: pegnancy Intrauterine pregnancy: [redacted]w[redacted]Winters     Secondary diagnosis:   Active Problems:   Post term pregnancy at [redacted] weeks gestation    Discharge diagnosis: Term Pregnancy Delivered and Anemia                                            Postpartum procedures: None  Complications: None  Outpatient Follow-Up: [ ]  continue to counsel on method of contraception  Hospital course: Catherine Winters is a 23 y.o. [redacted]w[redacted]Winters who was admitted for induction for post-dates pregnance. Her pregnancy was uncomplicated. Her labor course was notable for induction with pitocin, SROM and epidural placement. Delivery was complicated by 1st degree laceration (repaired), EBL of about 700cc, and meconium-stained fluid. Please see delivery/op note for additional details. Her postpartum course was uncomplicated. Hgb on PPD#2 was 8.6 and had been 9.5 on admission. She denies dizziness or s/s of anemia. She was breastfeeding without difficulty. By day of discharge, she was passing flatus, urinating, eating and drinking without difficulty. Her pain was well-controlled, and she was discharged home with Motrin. She will follow-up in clinic in 4 weeks.   Physical exam  Vitals:   10/03/18 1100 10/03/18 1500 10/03/18 2235 10/04/18 0615  BP: 111/63 112/65 124/67 112/64  Pulse: 88 93 91 87  Resp: 16 17 17 18   Temp: 98.6 F (37 C) 98.7 F (37.1 C) 98.9 F (37.2 C) 98.4 F (36.9 C)  TempSrc: Oral Oral Oral Oral  SpO2: 100% 100%  99%  Weight:      Height:       General: alert, cooperative, no distress Lochia: appropriate Uterine Fundus: firm Incision: N/A DVT Evaluation: No evidence of DVT seen on physical exam.  Labs: Lab Results  Component Value Date   WBC 8.0 10/04/2018   HGB 8.6 (L)  10/04/2018   HCT 27.7 (L) 10/04/2018   MCV 94.2 10/04/2018   PLT 181 10/04/2018   CMP Latest Ref Rng & Units 11/22/2012  Glucose 70 - 99 mg/dL 101(H)  BUN 6 - 23 mg/dL 5(L)  Creatinine 0.50 - 1.10 mg/dL 0.56  Sodium 135 - 145 mEq/L 133(L)  Potassium 3.5 - 5.1 mEq/L 3.5  Chloride 96 - 112 mEq/L 98  CO2 19 - 32 mEq/L 23  Calcium 8.4 - 10.5 mg/dL 9.8    Discharge instructions: Per After Visit Summary and "Baby and Me Booklet"  After visit meds:  Allergies as of 10/04/2018   No Known Allergies     Medication List    STOP taking these medications   acetaminophen 500 MG tablet Commonly known as: TYLENOL     TAKE these medications   ferrous sulfate 325 (65 FE) MG tablet Take 325 mg by mouth 2 (two) times daily with a meal.   ibuprofen 800 MG tablet Commonly known as: ADVIL Take 1 tablet (800 mg total) by mouth every 8 (eight) hours as needed.   multivitamin-prenatal 27-0.8 MG Tabs tablet Take 1 tablet by mouth daily at 12 noon.       Postpartum contraception: declines currently- unsure; options rev'Winters Diet: Routine Diet Activity: Advance as tolerated. Pelvic rest for 6 weeks.   Follow-up Appt: Encouraged to call health  department to schedule postpartum follow-up visit  Newborn Data: Live born female  Birth Weight:  3705gm (8lb 2.7oz) APGAR: 9, 9  Newborn Delivery   Birth date/time: 10/02/2018 22:23:00 Delivery type: Vaginal, Spontaneous     Baby Feeding: Breast Disposition:home with mother  Catherine Winters  [facility] HospitalCNM 10/04/2018 8:51 AM

## 2018-10-02 NOTE — Progress Notes (Signed)
Discussed variability with Julianne Handler, CNM. Non-reactive but reassuring. 500cc bolus given.

## 2018-10-02 NOTE — Anesthesia Procedure Notes (Signed)
Epidural Patient location during procedure: OB Start time: 10/02/2018 6:02 PM End time: 10/02/2018 6:05 PM  Staffing Anesthesiologist: Brennan Bailey, MD Performed: anesthesiologist   Preanesthetic Checklist Completed: patient identified, pre-op evaluation, timeout performed, IV checked, risks and benefits discussed and monitors and equipment checked  Epidural Patient position: sitting Prep: site prepped and draped and DuraPrep Patient monitoring: continuous pulse ox, blood pressure, heart rate and cardiac monitor Approach: midline Location: L3-L4 Injection technique: LOR air  Needle:  Needle type: Tuohy  Needle gauge: 17 G Needle length: 9 cm Needle insertion depth: 5 cm Catheter type: closed end flexible Catheter size: 19 Gauge Catheter at skin depth: 10 cm Test dose: negative and Other (1% lidocaine)  Assessment Events: blood not aspirated, injection not painful, no injection resistance, negative IV test and no paresthesia  Additional Notes Patient identified. Risks, benefits, and alternatives discussed with patient including but not limited to bleeding, infection, nerve damage, paralysis, failed block, incomplete pain control, headache, blood pressure changes, nausea, vomiting, reactions to medication, itching, and postpartum back pain. Confirmed with bedside nurse the patient's most recent platelet count. Confirmed with patient that they are not currently taking any anticoagulation, have any bleeding history, or any family history of bleeding disorders. Patient expressed understanding and wished to proceed. All questions were answered. Sterile technique was used throughout the entire procedure. Crisp LOR on first pass. Please see nursing notes for vital signs. Test dose was given through epidural catheter and negative prior to continuing to dose epidural or start infusion. Warning signs of high block given to the patient including shortness of breath, tingling/numbness in  hands, complete motor block, or any concerning symptoms with instructions to call for help. Patient was given instructions on fall risk and not to get out of bed. All questions and concerns addressed with instructions to call with any issues or inadequate analgesia.  Reason for block:procedure for pain

## 2018-10-02 NOTE — Progress Notes (Addendum)
OB/GYN Faculty Practice: Labor Progress Note  Subjective:  Patient doing well.  Strengthening contractions.  Objective:  BP 125/68   Pulse 95   Temp 98.8 F (37.1 C)   Resp 18   Ht 5' 6.5" (1.689 m)   Wt 106.6 kg   SpO2 100%   BMI 37.36 kg/m  Gen: Lying in bed comfortably Extremities: Moving spontaneously, no erythema or swelling. CE: 2.5cm, exam by Dr. Maudie Mercury FH:  BL 145, moderate var, - accelerations, - decelerations.   Assessment and Plan:  Catherine Winters is a 23 y.o. T7S1779 at [redacted]w[redacted]d -IOL-postdates  Labor: Cervix initially palpated at 2-3cm and obtained consent for balloon. Attempted foley balloon; however on recheck, difficult to palpate cervix and bladder full. Will return to recheck. Pitocin at 16 milliunits.  . Pain control: Epidural . Anticipated MOD: NSVD . PPH Risk: Low  Fetal Wellbeing: Cat II tracing. S/p 500cc bolus at 1214. Continue to monitor closely.  . GBS Negative (05/14 0000)  . Continuous fetal monitoring  Zettie Cooley, M.D.  Family Medicine  PGY-1 10/02/2018 1:15 PM  Midwife attestation: I have reviewed the progress note, cervix rechecked by me> 6/70/-2. Pt getting more uncomfortable. Continue Pitocin. Anticipate SVD.

## 2018-10-02 NOTE — Progress Notes (Signed)
After discussion with Jinny Sanders, CNM, pitocin started instead of cytotec. Patient remains on clear liquid diet.

## 2018-10-02 NOTE — Anesthesia Preprocedure Evaluation (Signed)
Anesthesia Evaluation  Patient identified by MRN, date of birth, ID band Patient awake    Reviewed: Allergy & Precautions, Patient's Chart, lab work & pertinent test results  History of Anesthesia Complications Negative for: history of anesthetic complications  Airway Mallampati: II  TM Distance: >3 FB Neck ROM: Full    Dental no notable dental hx.    Pulmonary neg pulmonary ROS,    Pulmonary exam normal        Cardiovascular negative cardio ROS Normal cardiovascular exam     Neuro/Psych negative neurological ROS  negative psych ROS   GI/Hepatic negative GI ROS, Neg liver ROS,   Endo/Other  negative endocrine ROS  Renal/GU negative Renal ROS  negative genitourinary   Musculoskeletal negative musculoskeletal ROS (+)   Abdominal   Peds negative pediatric ROS (+)  Hematology  (+) anemia , Hgb 9.5   Anesthesia Other Findings   Reproductive/Obstetrics (+) Pregnancy                             Anesthesia Physical Anesthesia Plan  ASA: II  Anesthesia Plan: Epidural   Post-op Pain Management:    Induction:   PONV Risk Score and Plan: Treatment may vary due to age or medical condition  Airway Management Planned: Natural Airway  Additional Equipment:   Intra-op Plan:   Post-operative Plan:   Informed Consent: I have reviewed the patients History and Physical, chart, labs and discussed the procedure including the risks, benefits and alternatives for the proposed anesthesia with the patient or authorized representative who has indicated his/her understanding and acceptance.       Plan Discussed with:   Anesthesia Plan Comments:         Anesthesia Quick Evaluation

## 2018-10-02 NOTE — Progress Notes (Signed)
Dr. Maudie Mercury attempted foley bulb placement. Cervix maternal left and posterior. Unable to place at this time.

## 2018-10-02 NOTE — Progress Notes (Signed)
Dr. Maudie Mercury at bedside discussing with patient buccal cystotec now, then 4 hours foley bulb. Mom can eat breakfast and lunch per Dr. Maudie Mercury. Pitocin later.

## 2018-10-03 ENCOUNTER — Encounter (HOSPITAL_COMMUNITY): Payer: Self-pay

## 2018-10-03 LAB — RPR: RPR Ser Ql: NONREACTIVE

## 2018-10-03 MED ORDER — BENZOCAINE-MENTHOL 20-0.5 % EX AERO
1.0000 "application " | INHALATION_SPRAY | CUTANEOUS | Status: DC | PRN
  Filled 2018-10-03: qty 56

## 2018-10-03 MED ORDER — WITCH HAZEL-GLYCERIN EX PADS: 1.0000 "application " | MEDICATED_PAD | CUTANEOUS | Status: DC | PRN

## 2018-10-03 MED ORDER — SIMETHICONE 80 MG PO CHEW: 80.0000 mg | CHEWABLE_TABLET | ORAL | Status: DC | PRN

## 2018-10-03 MED ORDER — PRENATAL MULTIVITAMIN CH
1.0000 | ORAL_TABLET | Freq: Every day | ORAL | Status: DC
  Administered 2018-10-03: 1 via ORAL
  Filled 2018-10-03: qty 1

## 2018-10-03 MED ORDER — ZOLPIDEM TARTRATE 5 MG PO TABS: 5.0000 mg | ORAL_TABLET | Freq: Every evening | ORAL | Status: DC | PRN

## 2018-10-03 MED ORDER — DIPHENHYDRAMINE HCL 25 MG PO CAPS: 25.0000 mg | ORAL_CAPSULE | Freq: Four times a day (QID) | ORAL | Status: DC | PRN

## 2018-10-03 MED ORDER — ONDANSETRON HCL 4 MG/2ML IJ SOLN: 4.0000 mg | INTRAMUSCULAR | Status: DC | PRN

## 2018-10-03 MED ORDER — ACETAMINOPHEN 325 MG PO TABS
650.0000 mg | ORAL_TABLET | ORAL | Status: DC | PRN
  Administered 2018-10-03: 06:00:00 650 mg via ORAL
  Filled 2018-10-03: qty 2

## 2018-10-03 MED ORDER — COCONUT OIL OIL
1.0000 "application " | TOPICAL_OIL | Status: DC | PRN
  Administered 2018-10-03: 1 via TOPICAL

## 2018-10-03 MED ORDER — DIBUCAINE (PERIANAL) 1 % EX OINT: 1.0000 "application " | TOPICAL_OINTMENT | CUTANEOUS | Status: DC | PRN

## 2018-10-03 MED ORDER — DOCUSATE SODIUM 100 MG PO CAPS
100.0000 mg | ORAL_CAPSULE | Freq: Two times a day (BID) | ORAL | Status: DC
  Administered 2018-10-03 – 2018-10-04 (×4): 100 mg via ORAL
  Filled 2018-10-03 (×4): qty 1

## 2018-10-03 MED ORDER — IBUPROFEN 800 MG PO TABS
800.0000 mg | ORAL_TABLET | Freq: Three times a day (TID) | ORAL | Status: DC
  Administered 2018-10-03 – 2018-10-04 (×5): 800 mg via ORAL
  Filled 2018-10-03 (×5): qty 1

## 2018-10-03 MED ORDER — ONDANSETRON HCL 4 MG PO TABS: 4.0000 mg | ORAL_TABLET | ORAL | Status: DC | PRN

## 2018-10-03 NOTE — Anesthesia Postprocedure Evaluation (Signed)
Anesthesia Post Note  Patient: Catherine Winters  Procedure(s) Performed: AN AD HOC LABOR EPIDURAL     Patient location during evaluation: Mother Baby Anesthesia Type: Epidural Level of consciousness: awake and alert Pain management: pain level controlled Vital Signs Assessment: post-procedure vital signs reviewed and stable Respiratory status: spontaneous breathing, nonlabored ventilation and respiratory function stable Cardiovascular status: stable Postop Assessment: no headache, no backache and epidural receding Anesthetic complications: no    Last Vitals:  Vitals:   10/03/18 0136 10/03/18 0607  BP: 131/67 117/68  Pulse: 97 86  Resp: 16 16  Temp: 36.9 C 36.9 C  SpO2: 100% 100%    Last Pain:  Vitals:   10/03/18 0607  TempSrc: Oral  PainSc:    Pain Goal:                   Rayvon Char

## 2018-10-03 NOTE — Lactation Note (Signed)
This note was copied from a baby's chart. Lactation Consultation Note Baby 7 hrs old.  Mom holding baby in cradle position BF. Baby swaddled in 2 blankets. Un-wrapped encouraged STS while feeding. Baby cheeks not to breast. Seeing mouth. Baby tongue thrusting. Mom has large everted nipples. Mom hand express colostrum. Mom stated she BF her 57 and 24 yr old for over 1 yr each. Reviewed newborn feeding habits and importance of I&O. Mom stated BF going well. Placed pillows for comfort for support while feeding. Encouraged mom to call for assistance or questions. Lactation brochure given.  Patient Name: Catherine Winters [id number] Date: 10/03/2018 Reason for consult: Initial assessment;Term   Maternal Data Has patient been taught Hand Expression?: Yes Does the patient have breastfeeding experience prior to this delivery?: Yes  Feeding Feeding Type: Breast Fed  LATCH Score Latch: Grasps breast easily, tongue down, lips flanged, rhythmical sucking.  Audible Swallowing: A few with stimulation  Type of Nipple: Everted at rest and after stimulation  Comfort (Breast/Nipple): Soft / non-tender  Hold (Positioning): Assistance needed to correctly position infant at breast and maintain latch.  LATCH Score: 8  Interventions Interventions: Breast feeding basics reviewed;Adjust position;Assisted with latch;Support pillows;Breast massage;Breast compression  Lactation Tools Discussed/Used     Consult Status Consult Status: Follow-up Date: 10/04/18 Follow-up type: In-patient    Theodoro Kalata 10/03/2018, 5:46 AM

## 2018-10-03 NOTE — Lactation Note (Signed)
This note was copied from a baby's chart. Lactation Consultation Note  Patient Name: Catherine Winters [id number] Date: 10/03/2018 Reason for consult: Follow-up assessment;Term  47 hours old FT female who is being exclusively BF by her mother's she's a P3 and experienced BF. Baby asleep when entering the room, offered assistance with latch but mom politely declined, she's already fed her. Asked mom to call for assistance when needed. Reviewed safe sleep practices, baby's swaddle was very loose and mom kept putting blankets around baby. Showed parents how to swaddle baby.  Per mom BF is going well but she's not sure if baby is getting "anything" she asked if she could have a hand pump to see how much she's getting, mom wasn't interested in a DEBP, she said she wanted a hand pump because she doesn't have one at home; she also requested colostrum containers. Instructions, cleaning and storage were reviewed as well as milk storage guidelines. Reviewed cluster feeding and size of baby's stomach during the first 24 hours, provided mom reassurance that her breastmilk most likely be enough to feed her baby at this stage.  Feeding plan:  1. Encouraged mom to feed baby STS 8-12 times/24 hours or sooner if feeding cues are present 2. Hand expression/pumping and finger feeding was also encouraged  Parents reported all questions and concerns were answered, they're both aware of [place] services and will call PRN.  Maternal Data    Feeding Feeding Type: Breast Fed   Interventions Interventions: Breast feeding basics reviewed;Hand pump  Lactation Tools Discussed/Used Pump Review: Setup, frequency, and cleaning;Milk Storage Initiated by:: MPeck Date initiated:: 10/03/18   Consult Status Consult Status: PRN Date: 10/04/18 Follow-up type: In-patient    Quinntin Malter Francene Boyers 10/03/2018, 1:16 PM

## 2018-10-03 NOTE — Progress Notes (Addendum)
Postpartum Day 1  Subjective Up ad lib, voiding, tolerating PO, and + flatus. Denies dizziness, lightheadedness, tachycardia. Appropriate Lochia. Pain well controlled.  Objective BP 117/68 (BP Location: Right Arm)   Pulse 86   Temp 98.5 F (36.9 C) (Oral)   Resp 16   Ht 5' 6.5" (1.689 m)   Wt 106.6 kg   SpO2 100%   Breastfeeding Unknown   BMI 37.36 kg/m  Intake/Output      06/18 0701 - 06/19 0700 06/19 0701 - 06/20 0700   I.V. (mL/kg) 0 (0)    Other 0    Total Intake(mL/kg) 0 (0)    Urine (mL/kg/hr) 1150    Blood 651    Total Output 1801    Net -1801         Urine Occurrence 1 x      Physical Exam:  General: alert, cooperative, appears stated age, fatigued, and no distress Lungs: CTAB, no crackles or wheezes Heart: RRR, no m/r/r.  Lochia: appropriate Uterine Fundus: firm Incision: NA DVT Evaluation: No evidence of DVT seen on physical exam, extremities warm and well perfused.   Recent Labs    10/02/18 0836  HGB 9.5*  HCT 29.9*    Assessment & Plan Postpartum Day # 1 . Plan for d/c tomorrow  . Continue daily lactation  . Contraception: discussed and patient is undecided.  . AM CBC for EBL 651   LOS: 1 day   Wilber Oliphant 10/03/2018, 7:26 AM   I confirm that I have verified the information documented in the resident's note and that I have also personally reperformed the history, physical exam and all medical decision making activities of this service and have verified that all service and findings are accurately documented in this resident's note.     Marcille Buffy DNP, CNM  10/03/18  9:22 AM

## 2018-10-04 LAB — CBC
HCT: 27.7 % — ABNORMAL LOW (ref 36.0–46.0)
Hemoglobin: 8.6 g/dL — ABNORMAL LOW (ref 12.0–15.0)
MCH: 29.3 pg (ref 26.0–34.0)
MCHC: 31 g/dL (ref 30.0–36.0)
MCV: 94.2 fL (ref 80.0–100.0)
Platelets: 181 10*3/uL (ref 150–400)
RBC: 2.94 MIL/uL — ABNORMAL LOW (ref 3.87–5.11)
RDW: 15.1 % (ref 11.5–15.5)
WBC: 8 10*3/uL (ref 4.0–10.5)
nRBC: 0 % (ref 0.0–0.2)

## 2018-10-04 MED ORDER — IBUPROFEN 800 MG PO TABS: 800.0000 mg | ORAL_TABLET | Freq: Three times a day (TID) | ORAL | 0 refills | Status: DC | PRN

## 2018-10-04 NOTE — Discharge Instructions (Signed)
Vaginal Delivery, Care After °Refer to this sheet in the next few weeks. These instructions provide you with information about caring for yourself after vaginal delivery. Your health care provider may also give you more specific instructions. Your treatment has been planned according to current medical practices, but problems sometimes occur. Call your health care provider if you have any problems or questions. °What can I expect after the procedure? °After vaginal delivery, it is common to have: °· Some bleeding from your vagina. °· Soreness in your abdomen, your vagina, and the area of skin between your vaginal opening and your anus (perineum). °· Pelvic cramps. °· Fatigue. °Follow these instructions at home: °Medicines °· Take over-the-counter and prescription medicines only as told by your health care provider. °· If you were prescribed an antibiotic medicine, take it as told by your health care provider. Do not stop taking the antibiotic until it is finished. °Driving ° °· Do not drive or operate heavy machinery while taking prescription pain medicine. °· Do not drive for 24 hours if you received a sedative. °Lifestyle °· Do not drink alcohol. This is especially important if you are breastfeeding or taking medicine to relieve pain. °· Do not use tobacco products, including cigarettes, chewing tobacco, or e-cigarettes. If you need help quitting, ask your health care provider. °Eating and drinking °· Drink at least 8 eight-ounce glasses of water every day unless you are told not to by your health care provider. If you choose to breastfeed your baby, you may need to drink more water than this. °· Eat high-fiber foods every day. These foods may help prevent or relieve constipation. High-fiber foods include: °? Whole grain cereals and breads. °? Brown rice. °? Beans. °? Fresh fruits and vegetables. °Activity °· Return to your normal activities as told by your health care provider. Ask your health care provider what  activities are safe for you. °· Rest as much as possible. Try to rest or take a nap when your baby is sleeping. °· Do not lift anything that is heavier than your baby or 10 lb (4.5 kg) until your health care provider says that it is safe. °· Talk with your health care provider about when you can engage in sexual activity. This may depend on your: °? Risk of infection. °? Rate of healing. °? Comfort and desire to engage in sexual activity. °Vaginal Care °· If you have an episiotomy or a vaginal tear, check the area every day for signs of infection. Check for: °? More redness, swelling, or pain. °? More fluid or blood. °? Warmth. °? Pus or a bad smell. °· Do not use tampons or douches until your health care provider says this is safe. °· Watch for any blood clots that may pass from your vagina. These may look like clumps of dark red, brown, or black discharge. °General instructions °· Keep your perineum clean and dry as told by your health care provider. °· Wear loose, comfortable clothing. °· Wipe from front to back when you use the toilet. °· Ask your health care provider if you can shower or take a bath. If you had an episiotomy or a perineal tear during labor and delivery, your health care provider may tell you not to take baths for a certain length of time. °· Wear a bra that supports your breasts and fits you well. °· If possible, have someone help you with household activities and help care for your baby for at least a few days after you   leave the hospital. °· Keep all follow-up visits for you and your baby as told by your health care provider. This is important. °Contact a health care provider if: °· You have: °? Vaginal discharge that has a bad smell. °? Difficulty urinating. °? Pain when urinating. °? A sudden increase or decrease in the frequency of your bowel movements. °? More redness, swelling, or pain around your episiotomy or vaginal tear. °? More fluid or blood coming from your episiotomy or vaginal  tear. °? Pus or a bad smell coming from your episiotomy or vaginal tear. °? A fever. °? A rash. °? Little or no interest in activities you used to enjoy. °? Questions about caring for yourself or your baby. °· Your episiotomy or vaginal tear feels warm to the touch. °· Your episiotomy or vaginal tear is separating or does not appear to be healing. °· Your breasts are painful, hard, or turn red. °· You feel unusually sad or worried. °· You feel nauseous or you vomit. °· You pass large blood clots from your vagina. If you pass a blood clot from your vagina, save it to show to your health care provider. Do not flush blood clots down the toilet without having your health care provider look at them. °· You urinate more than usual. °· You are dizzy or light-headed. °· You have not breastfed at all and you have not had a menstrual period for 12 weeks after delivery. °· You have stopped breastfeeding and you have not had a menstrual period for 12 weeks after you stopped breastfeeding. °Get help right away if: °· You have: °? Pain that does not go away or does not get better with medicine. °? Chest pain. °? Difficulty breathing. °? Blurred vision or spots in your vision. °? Thoughts about hurting yourself or your baby. °· You develop pain in your abdomen or in one of your legs. °· You develop a severe headache. °· You faint. °· You bleed from your vagina so much that you fill two sanitary pads in one hour. °This information is not intended to replace advice given to you by your health care provider. Make sure you discuss any questions you have with your health care provider. °Document Released: 03/30/2000 Document Revised: 09/14/2015 Document Reviewed: 04/17/2015 °Elsevier Interactive Patient Education © 2019 [employer] Inc[employer] ° °

## 2018-10-04 NOTE — Lactation Note (Signed)
This note was copied from a baby's chart. Lactation Consultation Note  Patient Name: Catherine Winters TKPTW'S Date: 10/04/2018 Reason for consult: Follow-up assessment Baby is 34 hours old/7% weight loss.  Mom reports that feedings are going well.  She denies questions or concerns.  Discussed milk coming to volume and the prevention and treatment of engorgement.  Mom has a manual pump for prn use.  Reviewed lactation services and encouraged to call prn.  Maternal Data    Feeding Feeding Type: Breast Fed  LATCH Score                   Interventions    Lactation Tools Discussed/Used     Consult Status Consult Status: Complete Follow-up type: Call as needed    Ave Filter 10/04/2018, 9:11 AM

## 2020-04-16 NOTE — L&D Delivery Note (Addendum)
OB/GYN Faculty Practice Delivery Note  Catherine Winters is a 25 y.o. [id number] s/p PPD#0 SVD at [redacted]w[redacted]d. She was admitted for SROM and normal labor.   ROM: 57h 88m with clear fluid GBS Status: Neg Maximum Maternal Temperature: 98.1  Labor Progress: Patient arrived SROM 48 hours ago and progressed with the use of pitocin to complete.   Delivery Time: 1933  Delivery: Called to room and patient was complete and pushing. Head delivered direct OA. No nuchal cord present. Shoulder and body delivered in usual fashion. Infant with spontaneous cry, placed on mother's abdomen, dried and stimulated. Cord clamped x 2 after 1-minute delay, and cut by FOB under my direct supervision. Cord blood drawn. Placenta delivered spontaneously with gentle cord traction. Fundus firm with massage and Pitocin. Labia, perineum, vagina, and cervix were inspected, 1st degree perineal tear that was hemostatic and not repaired.   Placenta: Intact, three vessel cord appreciated Complications: None Lacerations: Hemostatic 1st degree perineal laceration EBL: 100 mL Analgesia: Epidural  Infant: Healthy and viable boy  APGARs 8, 9  Allee Jena Gauss, MD Center for [employer], [facility]    GME ATTESTATION:  I saw and evaluated the patient. I agree with the findings and the plan of care as documented in the resident's note. I was gowned, gloved, and present for the entire delivery.  I personally performed ppIUD insertion per procedure note below.  Post-Placental IUD Insertion Procedure Note Patient identified, informed consent signed prior to delivery, signed copy in chart, time out was performed.    Paragard IUD grasped between sterile gloved fingers. Sterile lubrication applied to sterile gloved hand for ease of insertion. Fundus identified through abdominal wall using non-insertion hand. IUD inserted to fundus with bimanual technique. IUD carefully released at the fundus and insertion hand gently  removed from vagina.    Patient tolerated procedure well.  Lot # [id number] Expiration Date 10/14/2025  Patient given post procedure instructions and IUD care card with expiration date.  Patient is asked to keep IUD strings tucked in her vagina until her postpartum follow up visit in 4-6 weeks. Patient advised to abstain from sexual intercourse and pulling on strings before her follow-up visit. Patient verbalized an understanding of the plan of care and agrees.   Evalina Field, MD OB Fellow, Faculty [facility], Center for [facility] 11/24/2020 8:25 PM

## 2020-06-01 ENCOUNTER — Ambulatory Visit: Payer: Self-pay | Admitting: Registered Nurse

## 2020-06-20 ENCOUNTER — Encounter: Payer: Self-pay | Admitting: Family

## 2020-07-05 ENCOUNTER — Other Ambulatory Visit: Payer: Self-pay | Admitting: Family

## 2020-07-05 DIAGNOSIS — Z3A19 19 weeks gestation of pregnancy: Secondary | ICD-10-CM

## 2020-07-05 DIAGNOSIS — Z363 Encounter for antenatal screening for malformations: Secondary | ICD-10-CM

## 2020-07-05 DIAGNOSIS — O350XX0 Maternal care for (suspected) central nervous system malformation in fetus, not applicable or unspecified: Secondary | ICD-10-CM

## 2020-07-05 DIAGNOSIS — O3503X0 Maternal care for (suspected) central nervous system malformation or damage in fetus, choroid plexus cysts, not applicable or unspecified: Secondary | ICD-10-CM

## 2020-07-12 ENCOUNTER — Encounter: Payer: Self-pay | Admitting: *Deleted

## 2020-07-14 ENCOUNTER — Ambulatory Visit: Payer: Medicaid Other | Attending: Family

## 2020-07-14 ENCOUNTER — Ambulatory Visit: Payer: Medicaid Other | Admitting: *Deleted

## 2020-07-14 ENCOUNTER — Other Ambulatory Visit: Payer: Self-pay

## 2020-07-14 ENCOUNTER — Encounter: Payer: Self-pay | Admitting: *Deleted

## 2020-07-14 VITALS — BP 113/61 | HR 89

## 2020-07-14 DIAGNOSIS — O350XX0 Maternal care for (suspected) central nervous system malformation in fetus, not applicable or unspecified: Secondary | ICD-10-CM | POA: Insufficient documentation

## 2020-07-14 DIAGNOSIS — Z363 Encounter for antenatal screening for malformations: Secondary | ICD-10-CM

## 2020-07-14 DIAGNOSIS — O3503X0 Maternal care for (suspected) central nervous system malformation or damage in fetus, choroid plexus cysts, not applicable or unspecified: Secondary | ICD-10-CM

## 2020-07-14 DIAGNOSIS — Z3A19 19 weeks gestation of pregnancy: Secondary | ICD-10-CM | POA: Insufficient documentation

## 2020-09-27 ENCOUNTER — Other Ambulatory Visit (HOSPITAL_COMMUNITY): Payer: Self-pay | Admitting: *Deleted

## 2020-09-29 ENCOUNTER — Encounter (HOSPITAL_COMMUNITY)
Admission: RE | Admit: 2020-09-29 | Discharge: 2020-09-29 | Disposition: A | Discharge status: Home / Self Care | Payer: Medicaid Other | Source: Ambulatory Visit | Attending: Obstetrics & Gynecology | Admitting: Obstetrics & Gynecology

## 2020-09-29 ENCOUNTER — Other Ambulatory Visit: Payer: Self-pay

## 2020-09-29 DIAGNOSIS — Z3A00 Weeks of gestation of pregnancy not specified: Secondary | ICD-10-CM | POA: Insufficient documentation

## 2020-09-29 DIAGNOSIS — O99019 Anemia complicating pregnancy, unspecified trimester: Secondary | ICD-10-CM | POA: Diagnosis not present

## 2020-09-29 MED ORDER — SODIUM CHLORIDE 0.9 % IV SOLN
510.0000 mg | INTRAVENOUS | Status: DC
  Administered 2020-09-29: 510 mg via INTRAVENOUS
  Filled 2020-09-29: qty 510

## 2020-10-05 ENCOUNTER — Other Ambulatory Visit (HOSPITAL_COMMUNITY): Payer: Self-pay

## 2020-10-06 ENCOUNTER — Encounter (HOSPITAL_COMMUNITY)
Admission: RE | Admit: 2020-10-06 | Discharge: 2020-10-06 | Disposition: A | Discharge status: Home / Self Care | Payer: Medicaid Other | Source: Ambulatory Visit | Attending: Obstetrics & Gynecology | Admitting: Obstetrics & Gynecology

## 2020-10-06 ENCOUNTER — Other Ambulatory Visit: Payer: Self-pay

## 2020-10-06 DIAGNOSIS — O99019 Anemia complicating pregnancy, unspecified trimester: Secondary | ICD-10-CM | POA: Diagnosis not present

## 2020-10-06 MED ORDER — SODIUM CHLORIDE 0.9 % IV SOLN
510.0000 mg | Freq: Once | INTRAVENOUS | Status: AC
  Administered 2020-10-06: 510 mg via INTRAVENOUS
  Filled 2020-10-06: qty 510

## 2020-11-24 ENCOUNTER — Inpatient Hospital Stay (HOSPITAL_COMMUNITY): Payer: Medicaid Other | Admitting: Anesthesiology

## 2020-11-24 ENCOUNTER — Encounter (HOSPITAL_COMMUNITY): Payer: Self-pay | Admitting: Obstetrics & Gynecology

## 2020-11-24 ENCOUNTER — Inpatient Hospital Stay (HOSPITAL_COMMUNITY)
Admission: AD | Admit: 2020-11-24 | Discharge: 2020-11-26 | DRG: 807 | Disposition: A | Discharge status: Home / Self Care | Payer: Medicaid Other | Attending: Obstetrics & Gynecology | Admitting: Obstetrics & Gynecology

## 2020-11-24 ENCOUNTER — Other Ambulatory Visit: Payer: Self-pay

## 2020-11-24 DIAGNOSIS — O99019 Anemia complicating pregnancy, unspecified trimester: Secondary | ICD-10-CM

## 2020-11-24 DIAGNOSIS — Z3A38 38 weeks gestation of pregnancy: Secondary | ICD-10-CM

## 2020-11-24 DIAGNOSIS — Z20822 Contact with and (suspected) exposure to covid-19: Secondary | ICD-10-CM | POA: Diagnosis present

## 2020-11-24 DIAGNOSIS — Z30430 Encounter for insertion of intrauterine contraceptive device: Secondary | ICD-10-CM

## 2020-11-24 DIAGNOSIS — O26893 Other specified pregnancy related conditions, third trimester: Secondary | ICD-10-CM | POA: Diagnosis present

## 2020-11-24 DIAGNOSIS — O42113 Preterm premature rupture of membranes, onset of labor more than 24 hours following rupture, third trimester: Secondary | ICD-10-CM

## 2020-11-24 DIAGNOSIS — O9902 Anemia complicating childbirth: Secondary | ICD-10-CM | POA: Diagnosis present

## 2020-11-24 LAB — CBC
HCT: 29.8 % — ABNORMAL LOW (ref 36.0–46.0)
Hemoglobin: 9.6 g/dL — ABNORMAL LOW (ref 12.0–15.0)
MCH: 30.7 pg (ref 26.0–34.0)
MCHC: 32.2 g/dL (ref 30.0–36.0)
MCV: 95.2 fL (ref 80.0–100.0)
Platelets: 220 10*3/uL (ref 150–400)
RBC: 3.13 MIL/uL — ABNORMAL LOW (ref 3.87–5.11)
RDW: 15.2 % (ref 11.5–15.5)
WBC: 7.2 10*3/uL (ref 4.0–10.5)
nRBC: 0.3 % — ABNORMAL HIGH (ref 0.0–0.2)

## 2020-11-24 LAB — TYPE AND SCREEN
ABO/RH(D): O POS
Antibody Screen: NEGATIVE

## 2020-11-24 LAB — SARS CORONAVIRUS 2 (TAT 6-24 HRS): SARS Coronavirus 2: NEGATIVE

## 2020-11-24 MED ORDER — SOD CITRATE-CITRIC ACID 500-334 MG/5ML PO SOLN
30.0000 mL | ORAL | Status: DC | PRN
Start: 2020-11-24 — End: 2020-11-24

## 2020-11-24 MED ORDER — LACTATED RINGERS IV SOLN: 500.0000 mL | INTRAVENOUS | Status: DC | PRN

## 2020-11-24 MED ORDER — BENZOCAINE-MENTHOL 20-0.5 % EX AERO: 1.0000 "application " | INHALATION_SPRAY | CUTANEOUS | Status: DC | PRN

## 2020-11-24 MED ORDER — PRENATAL MULTIVITAMIN CH
1.0000 | ORAL_TABLET | Freq: Every day | ORAL | Status: DC
  Administered 2020-11-25 – 2020-11-26 (×2): 1 via ORAL
  Filled 2020-11-24 (×2): qty 1

## 2020-11-24 MED ORDER — DIPHENHYDRAMINE HCL 25 MG PO CAPS: 25.0000 mg | ORAL_CAPSULE | Freq: Four times a day (QID) | ORAL | Status: DC | PRN

## 2020-11-24 MED ORDER — LACTATED RINGERS IV SOLN: 500.0000 mL | Freq: Once | INTRAVENOUS | Status: DC

## 2020-11-24 MED ORDER — FENTANYL-BUPIVACAINE-NACL 0.5-0.125-0.9 MG/250ML-% EP SOLN
12.0000 mL/h | EPIDURAL | Status: DC | PRN
  Administered 2020-11-24: 12 mL/h via EPIDURAL
  Filled 2020-11-24: qty 250

## 2020-11-24 MED ORDER — LACTATED RINGERS IV SOLN: INTRAVENOUS | Status: DC

## 2020-11-24 MED ORDER — WITCH HAZEL-GLYCERIN EX PADS: 1.0000 "application " | MEDICATED_PAD | CUTANEOUS | Status: DC | PRN

## 2020-11-24 MED ORDER — EPHEDRINE 5 MG/ML INJ: 10.0000 mg | INTRAVENOUS | Status: DC | PRN

## 2020-11-24 MED ORDER — OXYTOCIN-SODIUM CHLORIDE 30-0.9 UT/500ML-% IV SOLN
2.5000 [IU]/h | INTRAVENOUS | Status: DC
  Filled 2020-11-24: qty 500

## 2020-11-24 MED ORDER — ONDANSETRON HCL 4 MG/2ML IJ SOLN: 4.0000 mg | INTRAMUSCULAR | Status: DC | PRN

## 2020-11-24 MED ORDER — PHENYLEPHRINE 40 MCG/ML (10ML) SYRINGE FOR IV PUSH (FOR BLOOD PRESSURE SUPPORT)
80.0000 ug | PREFILLED_SYRINGE | INTRAVENOUS | Status: DC | PRN
  Filled 2020-11-24: qty 10

## 2020-11-24 MED ORDER — IBUPROFEN 600 MG PO TABS
600.0000 mg | ORAL_TABLET | Freq: Four times a day (QID) | ORAL | Status: DC
  Administered 2020-11-24 – 2020-11-26 (×7): 600 mg via ORAL
  Filled 2020-11-24 (×7): qty 1

## 2020-11-24 MED ORDER — ONDANSETRON HCL 4 MG PO TABS: 4.0000 mg | ORAL_TABLET | ORAL | Status: DC | PRN

## 2020-11-24 MED ORDER — ACETAMINOPHEN 325 MG PO TABS: 650.0000 mg | ORAL_TABLET | ORAL | Status: DC | PRN

## 2020-11-24 MED ORDER — TERBUTALINE SULFATE 1 MG/ML IJ SOLN: 0.2500 mg | Freq: Once | INTRAMUSCULAR | Status: DC | PRN

## 2020-11-24 MED ORDER — ONDANSETRON HCL 4 MG/2ML IJ SOLN: 4.0000 mg | Freq: Four times a day (QID) | INTRAMUSCULAR | Status: DC | PRN

## 2020-11-24 MED ORDER — DIPHENHYDRAMINE HCL 50 MG/ML IJ SOLN: 12.5000 mg | INTRAMUSCULAR | Status: DC | PRN

## 2020-11-24 MED ORDER — OXYCODONE-ACETAMINOPHEN 5-325 MG PO TABS: 2.0000 | ORAL_TABLET | ORAL | Status: DC | PRN

## 2020-11-24 MED ORDER — LIDOCAINE-EPINEPHRINE (PF) 1.5 %-1:200000 IJ SOLN
INTRAMUSCULAR | Status: DC | PRN
  Administered 2020-11-24: 3 mL via EPIDURAL

## 2020-11-24 MED ORDER — PARAGARD INTRAUTERINE COPPER IU IUD
1.0000 | INTRAUTERINE_SYSTEM | Freq: Once | INTRAUTERINE | Status: AC
  Administered 2020-11-24: 1 via INTRAUTERINE
  Filled 2020-11-24: qty 1

## 2020-11-24 MED ORDER — TETANUS-DIPHTH-ACELL PERTUSSIS 5-2.5-18.5 LF-MCG/0.5 IM SUSY: 0.5000 mL | PREFILLED_SYRINGE | Freq: Once | INTRAMUSCULAR | Status: DC

## 2020-11-24 MED ORDER — DIBUCAINE (PERIANAL) 1 % EX OINT: 1.0000 "application " | TOPICAL_OINTMENT | CUTANEOUS | Status: DC | PRN

## 2020-11-24 MED ORDER — PHENYLEPHRINE 40 MCG/ML (10ML) SYRINGE FOR IV PUSH (FOR BLOOD PRESSURE SUPPORT): 80.0000 ug | PREFILLED_SYRINGE | INTRAVENOUS | Status: DC | PRN

## 2020-11-24 MED ORDER — LIDOCAINE HCL (PF) 1 % IJ SOLN
30.0000 mL | INTRAMUSCULAR | Status: DC | PRN
Start: 2020-11-24 — End: 2020-11-24

## 2020-11-24 MED ORDER — SENNOSIDES-DOCUSATE SODIUM 8.6-50 MG PO TABS
2.0000 | ORAL_TABLET | Freq: Every day | ORAL | Status: DC
  Administered 2020-11-25 – 2020-11-26 (×2): 2 via ORAL
  Filled 2020-11-24 (×2): qty 2

## 2020-11-24 MED ORDER — SIMETHICONE 80 MG PO CHEW: 80.0000 mg | CHEWABLE_TABLET | ORAL | Status: DC | PRN

## 2020-11-24 MED ORDER — COCONUT OIL OIL
1.0000 "application " | TOPICAL_OIL | Status: DC | PRN
  Administered 2020-11-25: 1 via TOPICAL

## 2020-11-24 MED ORDER — LIDOCAINE HCL (PF) 1 % IJ SOLN
INTRAMUSCULAR | Status: DC | PRN
  Administered 2020-11-24: 6 mL via EPIDURAL

## 2020-11-24 MED ORDER — FENTANYL CITRATE (PF) 100 MCG/2ML IJ SOLN
50.0000 ug | INTRAMUSCULAR | Status: DC | PRN
  Administered 2020-11-24: 100 ug via INTRAVENOUS
  Filled 2020-11-24: qty 2

## 2020-11-24 MED ORDER — OXYTOCIN-SODIUM CHLORIDE 30-0.9 UT/500ML-% IV SOLN
1.0000 m[IU]/min | INTRAVENOUS | Status: DC
Start: 2020-11-24 — End: 2020-11-24
  Administered 2020-11-24: 2 m[IU]/min via INTRAVENOUS

## 2020-11-24 MED ORDER — OXYTOCIN BOLUS FROM INFUSION
333.0000 mL | Freq: Once | INTRAVENOUS | Status: AC
  Administered 2020-11-24: 333 mL via INTRAVENOUS

## 2020-11-24 MED ORDER — OXYCODONE-ACETAMINOPHEN 5-325 MG PO TABS: 1.0000 | ORAL_TABLET | ORAL | Status: DC | PRN

## 2020-11-24 NOTE — Anesthesia Preprocedure Evaluation (Signed)
Anesthesia Evaluation  Patient identified by MRN, date of birth, ID band Patient awake    Reviewed: Allergy & Precautions, H&P , NPO status , Patient's Chart, lab work & pertinent test results  Airway Mallampati: II   Neck ROM: full    Dental   Pulmonary neg pulmonary ROS,    breath sounds clear to auscultation       Cardiovascular negative cardio ROS   Rhythm:regular Rate:Normal     Neuro/Psych    GI/Hepatic   Endo/Other    Renal/GU      Musculoskeletal   Abdominal   Peds  Hematology  (+) Blood dyscrasia, anemia ,   Anesthesia Other Findings   Reproductive/Obstetrics (+) Pregnancy                             Anesthesia Physical Anesthesia Plan  ASA: 2  Anesthesia Plan: Epidural   Post-op Pain Management:    Induction: Intravenous  PONV Risk Score and Plan: 2 and Treatment may vary due to age or medical condition  Airway Management Planned: Natural Airway  Additional Equipment:   Intra-op Plan:   Post-operative Plan:   Informed Consent: I have reviewed the patients History and Physical, chart, labs and discussed the procedure including the risks, benefits and alternatives for the proposed anesthesia with the patient or authorized representative who has indicated his/her understanding and acceptance.     Dental advisory given  Plan Discussed with: CRNA, Anesthesiologist and Surgeon  Anesthesia Plan Comments:         Anesthesia Quick Evaluation

## 2020-11-24 NOTE — H&P (Addendum)
OBSTETRIC ADMISSION HISTORY AND PHYSICAL  Catherine Winters is a 25 y.o. female [phone] with IUP at [redacted]w[redacted]d by LMP presenting for normal labor with SROM on 8/9. She reports +FMs, no LOF, no VB, no blurry vision, headaches, peripheral edema, or RUQ pain.  She plans on breast and bottle feeding. She request IUDpp for birth control.  She received her prenatal care at [facility]   Pt was seen at [facility] today and had obvious SROM/gross pooling. Pt reports that she's been leaking for two days. She was last checked in office two weeks ago and was 2cm dilated.   Dating: By LMP (03/03/20) --->  Estimated Date of Delivery: 12/08/20  Sono:   @[redacted]w[redacted]d  by LMP, ( dating 2 weeks ahead), normal anatomy with bilateral choroid plexus cyst, cephalic presentation, placenta anterior lie, 395g, >99% EFW  Prenatal History/Complications: Anemia-admit Hgb pending   Past Medical History: Past Medical History:  Diagnosis Date   Anemia    UTI (urinary tract infection)     Past Surgical History: Past Surgical History:  Procedure Laterality Date   WISDOM TOOTH EXTRACTION      Obstetrical History: OB History     Gravida  5   Para  3   Term  3   Preterm  0   AB  1   Living  3      SAB  1   IAB  0   Ectopic  0   Multiple  0   Live Births  3           Social History Social History   Socioeconomic History   Marital status: Married    Spouse name: Not on file   Number of children: Not on file   Years of education: Not on file   Highest education level: Not on file  Occupational History   Not on file  Tobacco Use   Smoking status: Never   Smokeless tobacco: Never  Vaping Use   Vaping Use: Never used  Substance and Sexual Activity   Alcohol use: No   Drug use: No   Sexual activity: Not Currently  Other Topics Concern   Not on file  Social History Narrative   Not on file   Social Determinants of Health   Financial Resource Strain: Not on file  Food Insecurity: Not on file   Transportation Needs: Not on file  Physical Activity: Not on file  Stress: Not on file  Social Connections: Not on file    Family History: History reviewed. No pertinent family history.  Allergies: No Known Allergies  Medications Prior to Admission  Medication Sig Dispense Refill Last Dose   ferrous sulfate 325 (65 FE) MG tablet Take 325 mg by mouth 2 (two) times daily with a meal.      ibuprofen (ADVIL) 800 MG tablet Take 1 tablet (800 mg total) by mouth every 8 (eight) hours as needed. 30 tablet 0    Prenatal Vit-Fe Fumarate-FA (MULTIVITAMIN-PRENATAL) 27-0.8 MG TABS tablet Take 1 tablet by mouth daily at 12 noon.        Review of Systems   All systems reviewed and negative except as stated in HPI  Blood pressure 132/81, pulse 90, temperature 98.1 F (36.7 C), temperature source Oral, resp. rate 18, height 5\' 6"  (1.676 m), weight 97.1 kg, last menstrual period 03/03/2020, unknown if currently breastfeeding.  General appearance: alert, cooperative, and appears stated age Lungs: clear to auscultation bilaterally Heart: regular rate and rhythm Abdomen: soft, non-tender; bowel sounds  normal Pelvic: As stated below  Extremities: Homans sign is negative, no sign of DVT Presentation: cephalic via BSUS Fetal monitoringBaseline: 150 bpm, Variability: Good {> 6 bpm), Accelerations: Reactive, and Decelerations: Absent Uterine activity: Irregular and inconsistent  Dilation: 3 Effacement (%): 50 Station: -2 Exam by:: Dr. Jena Gauss   Prenatal labs: ABO, Rh: O pos 06/09/20--/--/PENDING (08/11 1121) Antibody: Neg 02/24/22PENDING (08/11 1121) Rubella: immune 06/09/20   RPR: Non-reactive 09/19/20   HBsAg: Non-reactive 06/09/20   HIV: Neg 09/19/20   GBS: Neg 11/10/20   1 hr Glucola normal (107) Genetic screening: low risk for fetal aneuploidy Anatomy US normal with bilateral choroid plexus cyst  Prenatal Transfer Tool  Maternal Diabetes: No Genetic Screening: Normal Maternal  Ultrasounds/Referrals: Other: bilateral CPCs found  Fetal Ultrasounds or other Referrals:  Referred to [facility] . Got a second opinion about CPCs with MFM  Maternal Substance Abuse:  No Significant Maternal Medications:  None Significant Maternal Lab Results: GBS negative, Varicella non-immune  Results for orders placed or performed during the hospital encounter of 11/24/20 (from the past 24 hour(s))  Type and screen [name]   Collection Time: 11/24/20 11:21 AM  Result Value Ref Range   ABO/RH(D) PENDING    Antibody Screen PENDING    Sample Expiration      11/27/2020,2359 Performed at [facility], [address], [place], [place] [address]     Patient Active Problem List   Diagnosis Date Noted   Normal labor 11/24/2020   Post term pregnancy at [redacted] weeks gestation 03/06/2017    Assessment/Plan:  Catherine Winters is a 25 y.o. G5P3013 at [redacted]w[redacted]d here for SROM.  #Labor: Pt is currently 3cm dilated with SROM 2 days ago. Will start pitocin, titrate as tolerated. Anticipate SVD.   #Pain: PRN #FWB: Cat 1 #ID: GBS neg #MOF: Both  #MOC: IUDpp vs OP. She is unsure about which to choose. Will discuss this with her husband when he arrives.  #Anemia: Admit Hgb pending #Circ: Yes  Alfredo Martinez, MD  11/24/2020, 12:01 PM   GME ATTESTATION:  I saw and evaluated the patient. I agree with the findings and the plan of care as documented in the resident's note. I have made changes to documentation as necessary.   Evalina Field, MD OB Fellow, Faculty [facility], Center for [facility] 11/24/2020 4:23 PM

## 2020-11-24 NOTE — Progress Notes (Signed)
Catherine Winters is a 25 y.o. [id number] at [redacted]w[redacted]d by LMP here for SROM.  Subjective: Uncomfortable with contractions. Planning for epidural soon. Would also like to proceed with post-placental Paraguard IUD.   Objective: BP 131/75   Pulse 85   Temp 98.4 F (36.9 C) (Oral)   Resp 16   Ht 5\' 6"  (1.676 m)   Wt 97.1 kg   LMP 03/03/2020   BMI 34.54 kg/m   FHT:  Baseline 140 bpm, moderate variability, no accels, no decels UC:   Every 3 minutes SVE:   Dilation: 5 Effacement (%): 60 Station: -3 Exam by:: [email or web address], RN  Labs: Lab Results  Component Value Date   WBC 7.2 11/24/2020   HGB 9.6 (L) 11/24/2020   HCT 29.8 (L) 11/24/2020   MCV 95.2 11/24/2020   PLT 220 11/24/2020    Assessment / Plan: Catherine Winters is a 25 y.o. 30 at [redacted]w[redacted]d by LMP here for SROM.  Labor: Making change since last check. Will continue Pitocin and reassess in 3-4 hours. Will minimize checks as able due to SROM. Fetal Wellbeing:  Cat 1  Pain Control:  Planning for epidural shortly I/D: GBS negative  Contraception: Discussed risks and benefits of post-placental IUD and Paraguard. Patient would like to proceed with placement. Consent signed.  Anticipated MOD:  NSVD  [redacted]w[redacted]d Obstetrics Fellow 11/24/2020, 4:30 PM

## 2020-11-24 NOTE — Discharge Instructions (Signed)
IUD Aftercare Instructions: Keep IUD strings tucked in your vagina until your postpartum follow up visit in 4-6 weeks. Abstain from sexual intercourse and pulling on strings before your follow-up visit.

## 2020-11-24 NOTE — Discharge Summary (Signed)
Postpartum Discharge Summary      Patient Name: Catherine Winters DOB: 1996/03/17 MRN: [id number]  Date of admission: 11/24/2020 Delivery date:11/24/2020  Delivering provider: Erskine Emery  Date of discharge: 11/26/2020  Admitting diagnosis: Normal labor [O80, Z37.9] Intrauterine pregnancy: 110w0d    Secondary diagnosis:  Principal Problem:   Vaginal delivery Active Problems:   Anemia affecting pregnancy  Additional problems: None    Discharge diagnosis: Term Pregnancy Delivered                                              Post partum procedures: Paraguard placement Augmentation: Pitocin Complications: None  Hospital course: Onset of Labor With Vaginal Delivery      25y.o. yo [id number] 340w0das admitted in Latent Labor on 11/24/2020 after SROM. Patient had an uncomplicated labor course as follows:  Membrane Rupture Time/Date: 10:00 AM ,11/22/2020   Delivery Method:Vaginal, Spontaneous  Episiotomy: None  Lacerations:  1st degree  Patient had an uncomplicated postpartum course.  She is ambulating, tolerating a regular diet, passing flatus, and urinating well. Patient is discharged home in stable condition on 11/26/20.  Newborn Data: Birth date:11/24/2020  Birth time:7:33 PM  Gender:Female  Living status:Living  Apgars:8 ,9  Weight:3680 g   Magnesium Sulfate received: No BMZ received: No Rhophylac:N/A MMR:N/A T-DaP: Declined Flu: No Transfusion:No  Physical exam  Vitals:   11/25/20 1000 11/25/20 1430 11/25/20 2001 11/26/20 0508  BP: 122/66 128/70 133/72 110/68  Pulse: 84 88 (!) 112 83  Resp: _0 Temp: 97.9 F (36.6 C) 98 F (36.7 C) 98.5 F (36.9 C) 98.1 F (36.7 C)  TempSrc: Oral Oral Oral Oral  SpO2: 100%   100%  Weight:      Height:       General: alert and cooperative Lochia: appropriate Uterine Fundus: firm Incision: N/A DVT Evaluation: No evidence of DVT seen on physical exam. Labs: Lab Results  Component Value Date   WBC 8.6 11/25/2020    HGB 9.6 (L) 11/25/2020   HCT 29.1 (L) 11/25/2020   MCV 94.8 11/25/2020   PLT 200 11/25/2020   CMP Latest Ref Rng & Units 11/22/2012  Glucose 70 - 99 mg/dL 101(H)  BUN 6 - 23 mg/dL 5(L)  Creatinine 0.50 - 1.10 mg/dL 0.56  Sodium 135 - 145 mEq/L 133(L)  Potassium 3.5 - 5.1 mEq/L 3.5  Chloride 96 - 112 mEq/L 98  CO2 19 - 32 mEq/L 23  Calcium 8.4 - 10.5 mg/dL 9.8   Edinburgh Score: Edinburgh Postnatal Depression Scale Screening Tool 10/03/2018  I have been able to laugh and see the funny side of things. 0  I have looked forward with enjoyment to things. 0  I have blamed myself unnecessarily when things went wrong. 1  I have been anxious or worried for no good reason. 0  I have felt scared or panicky for no good reason. 0  Things have been getting on top of me. 0  I have been so unhappy that I have had difficulty sleeping. 0  I have felt sad or miserable. 0  I have been so unhappy that I have been crying. 1  The thought of harming myself has occurred to me. 0  Edinburgh Postnatal Depression Scale Total 2     After visit meds:  Allergies as of 11/26/2020   No Known Allergies  Medication List     STOP taking these medications    cephALEXin 500 MG capsule Commonly known as: KEFLEX       TAKE these medications    acetaminophen 325 MG tablet Commonly known as: Tylenol Take 2 tablets (650 mg total) by mouth every 4 (four) hours as needed (for pain scale < 4).   coconut oil Oil Apply 1 application topically as needed.   ferrous sulfate 325 (65 FE) MG tablet Take 325 mg by mouth every other day.   ibuprofen 600 MG tablet Commonly known as: ADVIL Take 1 tablet (600 mg total) by mouth every 6 (six) hours.   Prenatal 27-1 MG Tabs Take 1 tablet by mouth daily.         Discharge home in stable condition Infant Feeding: Bottle and Breast Infant Disposition:home with mother Discharge instruction: per After Visit Summary and Postpartum booklet. Activity: Advance  as tolerated. Pelvic rest for 6 weeks.  Diet: routine diet Future Appointments:No future appointments.  Follow up Visit: Patient to call [facility] for postpartum visit in 6 weeks.   Please schedule this patient for a In person postpartum visit in 6 weeks with the following provider: Any provider. Additional Postpartum F/U: N/A   Low risk pregnancy complicated by:  N/A Delivery mode:  Vaginal, Spontaneous  Anticipated Birth Control:   Paragard IUD (post-placental)   11/26/2020 Delora Fuel, MD

## 2020-11-24 NOTE — Anesthesia Procedure Notes (Signed)
Epidural Patient location during procedure: OB Start time: 11/24/2020 4:26 PM End time: 11/24/2020 4:32 PM  Staffing Anesthesiologist: Achille Rich, MD Performed: anesthesiologist   Preanesthetic Checklist Completed: patient identified, IV checked, site marked, risks and benefits discussed, monitors and equipment checked, pre-op evaluation and timeout performed  Epidural Patient position: sitting Prep: DuraPrep Patient monitoring: heart rate, cardiac monitor, continuous pulse ox and blood pressure Approach: midline Location: L2-L3 Injection technique: LOR saline  Needle:  Needle type: Tuohy  Needle gauge: 17 G Needle length: 9 cm Needle insertion depth: 6 cm Catheter type: closed end flexible Catheter size: 19 Gauge Catheter at skin depth: 12 cm Test dose: negative and Other  Assessment Events: blood not aspirated, injection not painful, no injection resistance and negative IV test  Additional Notes Informed consent obtained prior to proceeding including risk of failure, 1% risk of PDPH, risk of minor discomfort and bruising.  Discussed rare but serious complications including epidural abscess, permanent nerve injury, epidural hematoma.  Discussed alternatives to epidural analgesia and patient desires to proceed.  Timeout performed pre-procedure verifying patient name, procedure, and platelet count.  Patient tolerated procedure well. Reason for block:procedure for pain

## 2020-11-25 DIAGNOSIS — Z3A38 38 weeks gestation of pregnancy: Secondary | ICD-10-CM

## 2020-11-25 DIAGNOSIS — O42113 Preterm premature rupture of membranes, onset of labor more than 24 hours following rupture, third trimester: Secondary | ICD-10-CM

## 2020-11-25 LAB — CBC
HCT: 29.1 % — ABNORMAL LOW (ref 36.0–46.0)
Hemoglobin: 9.6 g/dL — ABNORMAL LOW (ref 12.0–15.0)
MCH: 31.3 pg (ref 26.0–34.0)
MCHC: 33 g/dL (ref 30.0–36.0)
MCV: 94.8 fL (ref 80.0–100.0)
Platelets: 200 10*3/uL (ref 150–400)
RBC: 3.07 MIL/uL — ABNORMAL LOW (ref 3.87–5.11)
RDW: 15.3 % (ref 11.5–15.5)
WBC: 8.6 10*3/uL (ref 4.0–10.5)
nRBC: 0.2 % (ref 0.0–0.2)

## 2020-11-25 LAB — RPR: RPR Ser Ql: NONREACTIVE

## 2020-11-25 MED ORDER — FERROUS SULFATE 325 (65 FE) MG PO TABS: 325.0000 mg | ORAL_TABLET | ORAL | Status: DC

## 2020-11-25 NOTE — Social Work (Signed)
MOB was referred for history of depression and anxiety.   * Referral screened out by Clinical Social Worker because none of the following criteria appear to apply:  ~ History of anxiety/depression during this pregnancy, or of post-partum depression following prior delivery. ~ Diagnosis of anxiety and/or depression within last 3 years. CSW reviewed chart and does not note a history of anxiety/depression. OR * MOB's symptoms currently being treated with medication and/or therapy.  Please contact the Clinical Social Worker if needs arise, by [facility] request, or if MOB scores greater than 9/yes to question 10 on Edinburgh Postpartum Depression Screen.  Manfred Arch, LCSWA Clinical Social Work [employer] and [employer]  [phone]

## 2020-11-25 NOTE — Anesthesia Postprocedure Evaluation (Signed)
Anesthesia Post Note  Patient: Catherine Winters  Procedure(s) Performed: AN AD HOC LABOR EPIDURAL     Patient location during evaluation: Mother Baby Anesthesia Type: Epidural Level of consciousness: awake and alert Pain management: pain level controlled Vital Signs Assessment: post-procedure vital signs reviewed and stable Respiratory status: spontaneous breathing, nonlabored ventilation and respiratory function stable Cardiovascular status: stable Postop Assessment: no headache, no backache, epidural receding, no apparent nausea or vomiting, patient able to bend at knees, adequate PO intake and able to ambulate Anesthetic complications: no   No notable events documented.  Last Vitals:  Vitals:   11/25/20 0202 11/25/20 0617  BP: 123/61 114/75  Pulse: 76 76  Resp: 16   Temp: 36.7 C 36.7 C  SpO2: 100% 100%    Last Pain:  Vitals:   11/25/20 0617  TempSrc: Oral  PainSc: 3    Pain Goal:                   Laban Emperor

## 2020-11-25 NOTE — Progress Notes (Signed)
Post Partum Day 1  Subjective: no complaints, voiding, and tolerating PO Discussed circumcision and placed consent note in baby's chart  Objective: Blood pressure 114/75, pulse 76, temperature 98 F (36.7 C), temperature source Oral, resp. rate 16, height 5\' 6"  (1.676 m), weight 97.1 kg, last menstrual period 03/03/2020, SpO2 100 %, unknown if currently breastfeeding.  Physical Exam:  General: alert and cooperative Lochia: appropriate Uterine Fundus: firm DVT Evaluation: No evidence of DVT seen on physical exam.  Recent Labs    11/24/20 1048  HGB 9.6*  HCT 29.8*    Assessment/Plan: Plan for discharge tomorrow Patient progressing appropriately in post partum course. No symptoms of anemia. Vital signs stable. - Will start PO iron and follow up PP CBC to assess if need IV Venofer.    LOS: 1 day    01/24/21, MD, MPH OB Fellow, [facility]

## 2020-11-26 MED ORDER — IBUPROFEN 600 MG PO TABS: 600.0000 mg | ORAL_TABLET | Freq: Four times a day (QID) | ORAL | 0 refills | Status: AC

## 2020-11-26 MED ORDER — ACETAMINOPHEN 325 MG PO TABS: 650.0000 mg | ORAL_TABLET | ORAL | Status: AC | PRN

## 2020-11-26 MED ORDER — COCONUT OIL OIL: 1.0000 "application " | TOPICAL_OIL | 0 refills | Status: AC | PRN

## 2020-11-26 NOTE — Lactation Note (Signed)
This note was copied from a baby's chart. Lactation Consultation Note  Patient Name: Boy Shanedra Lave BMWUX'L Date: 11/26/2020 Reason for consult: Initial assessment;Nipple pain/trauma;Early term 37-38.6wks;Infant weight loss;Other (Comment) (post circ) Age:26 hours LC order placed today 11/26/2020 at 0821  Baby arrived back from his circ , attempted to latch and baby became to sleepy to latch. Per mom  baby fed at 10:30 am for 15 mins  with swallows prior to going for circ. Per mom has been supplementing due to her milk not being in.  Mom able to hand express large drops. Latch score 6 due to baby being sleepy after circ. Last Latch score prior to circ was 9  LC explained to mom post circ potential feeding behavior.  LC assessed breast tissue and noted no breakdown , just positional strips on the upper edge of the left nipple, right nipple clear. Areola edema one both , more compressible on the right than the left.  See the BF tools LC provided with instructions below and BF D/C teaching.  LC provided the [facility] brochure with resources.    Maternal Data Has patient been taught Hand Expression?: Yes  Feeding Mother's Current Feeding Choice: Breast Milk and Formula  LATCH Score Latch: Repeated attempts needed to sustain latch, nipple held in mouth throughout feeding, stimulation needed to elicit sucking reflex. (sleepy from circ)  Audible Swallowing: None  Type of Nipple: Everted at rest and after stimulation (areola edema)  Comfort (Breast/Nipple): Soft / non-tender  Hold (Positioning): Assistance needed to correctly position infant at breast and maintain latch.  LATCH Score: 6   Lactation Tools Discussed/Used Tools: Shells;Pump;Flanges;Comfort gels Flange Size: 27;30;Other (comment) (#27 F fits well today / and #30 for when the milk comes in) Breast pump type: Manual Pump Education: Milk Storage Reason for Pumping: PRN  Interventions Interventions: Breast feeding basics  reviewed;Assisted with latch;Skin to skin;Breast massage;Hand express;Pre-pump if needed;Reverse pressure;Breast compression;Adjust position;Support pillows;Position options;Shells;Comfort gels;Hand pump;Education  Discharge Discharge Education: Engorgement and breast care;Warning signs for feeding baby Pump: Personal;DEBP;Manual WIC Program: Yes  Consult Status Consult Status: Complete    Kathrin Greathouse 11/26/2020, 12:32 PM

## 2020-12-06 ENCOUNTER — Telehealth (HOSPITAL_COMMUNITY): Payer: Self-pay

## 2020-12-06 NOTE — Telephone Encounter (Signed)
"  We are doing fine. I'm feeling well." Patient has no questions or concerns about her healing.  "He is doing good. He is not latching so well. I've been working with the Advertising copywriter at my pediatrician office. He sleeps in a bassinet."  RN reviewed ABC's of safe sleep with patient. Patient declines any questions or concerns about baby.  EPDS score is 3.  Marcelino Duster [facility] 12/06/2020,1231

## 2021-08-14 ENCOUNTER — Emergency Department (HOSPITAL_COMMUNITY)
Admission: EM | Admit: 2021-08-14 | Discharge: 2021-08-14 | Disposition: A | Discharge status: Home / Self Care | Payer: Medicaid Other | Attending: Emergency Medicine | Admitting: Emergency Medicine

## 2021-08-14 DIAGNOSIS — E876 Hypokalemia: Secondary | ICD-10-CM | POA: Diagnosis not present

## 2021-08-14 DIAGNOSIS — R300 Dysuria: Secondary | ICD-10-CM | POA: Insufficient documentation

## 2021-08-14 DIAGNOSIS — R1031 Right lower quadrant pain: Secondary | ICD-10-CM | POA: Diagnosis not present

## 2021-08-14 DIAGNOSIS — D500 Iron deficiency anemia secondary to blood loss (chronic): Secondary | ICD-10-CM | POA: Insufficient documentation

## 2021-08-14 DIAGNOSIS — N3000 Acute cystitis without hematuria: Secondary | ICD-10-CM

## 2021-08-14 LAB — CBC
HCT: 29.2 % — ABNORMAL LOW (ref 36.0–46.0)
Hemoglobin: 9 g/dL — ABNORMAL LOW (ref 12.0–15.0)
MCH: 27 pg (ref 26.0–34.0)
MCHC: 30.8 g/dL (ref 30.0–36.0)
MCV: 87.7 fL (ref 80.0–100.0)
Platelets: 238 10*3/uL (ref 150–400)
RBC: 3.33 MIL/uL — ABNORMAL LOW (ref 3.87–5.11)
RDW: 12.4 % (ref 11.5–15.5)
WBC: 6.1 10*3/uL (ref 4.0–10.5)
nRBC: 0 % (ref 0.0–0.2)

## 2021-08-14 LAB — BASIC METABOLIC PANEL
Anion gap: 9 (ref 5–15)
BUN: <5 mg/dL — ABNORMAL LOW (ref 6–20)
CO2: 27 mmol/L (ref 22–32)
Calcium: 9.2 mg/dL (ref 8.9–10.3)
Chloride: 104 mmol/L (ref 98–111)
Creatinine, Ser: 0.73 mg/dL (ref 0.44–1.00)
GFR, Estimated: >60 mL/min (ref >60)
Glucose, Bld: 86 mg/dL (ref 70–99)
Potassium: 3.3 mmol/L — ABNORMAL LOW (ref 3.5–5.1)
Sodium: 140 mmol/L (ref 135–145)

## 2021-08-14 LAB — URINALYSIS, ROUTINE W REFLEX MICROSCOPIC
Bilirubin Urine: NEGATIVE
Glucose, UA: NEGATIVE mg/dL
Hgb urine dipstick: NEGATIVE
Ketones, ur: 80 mg/dL — AB
Nitrite: NEGATIVE
Protein, ur: 30 mg/dL — AB
Specific Gravity, Urine: 1.014 (ref 1.005–1.030)
WBC, UA: >50 WBC/hpf — ABNORMAL HIGH (ref 0–5)
pH: 6 (ref 5.0–8.0)

## 2021-08-14 LAB — POC URINE PREG, ED: Preg Test, Ur: NEGATIVE

## 2021-08-14 MED ORDER — CEPHALEXIN 500 MG PO CAPS: 500.0000 mg | ORAL_CAPSULE | Freq: Two times a day (BID) | ORAL | 0 refills | Status: AC

## 2021-08-14 MED ORDER — KETOROLAC TROMETHAMINE 60 MG/2ML IM SOLN
60.0000 mg | Freq: Once | INTRAMUSCULAR | Status: AC
  Administered 2021-08-14: 60 mg via INTRAMUSCULAR
  Filled 2021-08-14: qty 2

## 2021-08-14 NOTE — ED Provider Triage Note (Signed)
Emergency Medicine Provider Triage Evaluation Note ? ?Catherine Winters , a 26 y.o. female  was evaluated in triage.  Pt complains of back pain since Friday.  Patient reports that she has recently and exercising for the last 3 weeks but stated that on Friday she been experiencing left-sided back pain that is predominantly on the right side.  Patient also endorsing burning with urination.  Patient states that she had fever last night however she is afebrile here in triage.  Patient reports that she did take Tylenol last night but has not taken any today.  Patient states that the Tylenol does not relieve her back pain.  Patient denies any red flag symptoms. ? ?Review of Systems  ?Positive:  ?Negative:  ? ?Physical Exam  ?BP 116/72 (BP Location: Right Arm)   Pulse 83   Temp 98.6 ?F (37 ?C) (Oral)   Resp 20   SpO2 99%   Breastfeeding Yes  ?Gen:   Awake, no distress   ?Resp:  Normal effort  ?MSK:   Moves extremities without difficulty  ?Other:  Right-sided CVA tenderness ? ?Medical Decision Making  ?Medically screening exam initiated at 2:38 PM.  Appropriate orders placed.  Catherine Winters was informed that the remainder of the evaluation will be completed by another provider, this initial triage assessment does not replace that evaluation, and the importance of remaining in the ED until their evaluation is complete. ? ? ?  ?Al Decant, PA-C ?08/14/21 1439 ? ?

## 2021-08-14 NOTE — ED Notes (Signed)
Pt called x3 for triage, no response.  

## 2021-08-14 NOTE — Discharge Instructions (Signed)
You were evaluated in the Emergency Department and after careful evaluation, we did not find any emergent condition requiring admission or further testing in the hospital. ? ?Your work-up today showed that you likely have a UTI.  We have sent your urine for culture which will test which bacteria will grow out of your urine.  In the meantime, we will send you home with Keflex.  Please take as directed until finished.  Your hemoglobin today was 9 which is consistent with your history of anemia.  Your potassium was a little bit low today, it was 3.3 and we like to see it above 3.5.  You may take over-the-counter supplements for potassium.  Please make sure to follow-up with your primary care doctor of after this ER visit to make sure that there are no complications from your UTI.  If you do not have one, please call the phone number in your discharge paperwork in order to establish with 1. ? ?Please return to the Emergency Department if you experience any worsening of your condition.  Thank you for allowing [place] to be a part of your care. ? ?

## 2021-08-14 NOTE — ED Provider Notes (Signed)
?[name] EMERGENCY DEPARTMENT ?Provider Note ? ? ?CSN: [id number] ?Arrival date & time: 08/14/21  1244 ? ?  ? ?History ? ?Chief Complaint  ?Patient presents with  ? Back Pain  ? ? ?Catherine Winters is a 26 y.o. female. ? ?HPI ?26 year old female who presents to the ER with complaints of right back pain which began on Friday.  History of anemia and UTIs.  Patient reports right lower flank pain, worse with inspiration.  She states that she measured her temperature on Sunday and measured temperature of 100.2.  She has a history of UTIs, states that she has been having some burning with urination, no noticeable hematuria.  No pelvic pain or vaginal bleeding or discharge.  She denies any chest pain or shortness of breath.  No recent travel.  No lower extremity edema.  Presents to the ER with concerns for possible UTI however states that she does not usually have back pain with UTIs.  She took Tylenol for her fever but did not take any medications today.  Denies any numbness, weakness, foot drop, loss of bowel bladder control.  No history of IV drug use. ?  ? ?Home Medications ?Prior to Admission medications   ?Medication Sig Start Date End Date Taking? Authorizing Provider  ?acetaminophen (TYLENOL) 325 MG tablet Take 2 tablets (650 mg total) by mouth every 4 (four) hours as needed (for pain scale < 4). 11/26/20   Maness, Loistine Chance, MD  ?coconut oil OIL Apply 1 application topically as needed. 11/26/20   Maness, Loistine Chance, MD  ?ferrous sulfate 325 (65 FE) MG tablet Take 325 mg by mouth every other day.    [provider]  ?ibuprofen (ADVIL) 600 MG tablet Take 1 tablet (600 mg total) by mouth every 6 (six) hours. 11/26/20   Maness, Loistine Chance, MD  ?Prenatal 27-1 MG TABS Take 1 tablet by mouth daily. 10/28/20   [provider]  ?   ? ?Allergies    ?Patient has no known allergies.   ? ?Review of Systems   ?Review of Systems ?Ten systems reviewed and are negative for acute change, except as noted in the  HPI.  ? ?Physical Exam ?Updated Vital Signs ?BP 116/72 (BP Location: Right Arm)   Pulse 83   Temp 98.6 ?F (37 ?C) (Oral)   Resp 20   SpO2 99%   Breastfeeding Yes  ?Physical Exam ?Vitals and nursing note reviewed.  ?Constitutional:   ?   General: She is not in acute distress. ?   Appearance: She is well-developed.  ?HENT:  ?   Head: Normocephalic and atraumatic.  ?Eyes:  ?   Conjunctiva/sclera: Conjunctivae normal.  ?Cardiovascular:  ?   Rate and Rhythm: Normal rate and regular rhythm.  ?   Heart sounds: No murmur heard. ?Pulmonary:  ?   Effort: Pulmonary effort is normal. No respiratory distress.  ?   Breath sounds: Normal breath sounds.  ?Abdominal:  ?   Palpations: Abdomen is soft.  ?   Tenderness: There is no abdominal tenderness.  ?   Comments: No abdominal tenderness.  ?Musculoskeletal:     ?   General: Tenderness present. No swelling.  ?   Cervical back: Neck supple.  ?   Comments: Mild right-sided flank tenderness.  No midline tenderness.  5/5 strength upper lower extremities bilaterally.  Sensations intact in the lower extremities bilaterally.  Negative straight leg raise.  ?Skin: ?   General: Skin is warm and dry.  ?   Capillary Refill: Capillary  refill takes less than 2 seconds.  ?Neurological:  ?   General: No focal deficit present.  ?   Mental Status: She is alert and oriented to person, place, and time.  ?   Motor: No weakness.  ?Psychiatric:     ?   Mood and Affect: Mood normal.     ?   Behavior: Behavior normal.  ? ? ?ED Results / Procedures / Treatments   ?Labs ?(all labs ordered are listed, but only abnormal results are displayed) ?Labs Reviewed  ?CBC - Abnormal; Notable for the following components:  ?    Result Value  ? RBC 3.33 (*)   ? Hemoglobin 9.0 (*)   ? HCT 29.2 (*)   ? All other components within normal limits  ?URINALYSIS, ROUTINE W REFLEX MICROSCOPIC  ?BASIC METABOLIC PANEL  ?POC URINE PREG, ED  ? ? ?EKG ?None ? ?Radiology ?No results found. ? ?Procedures ?Procedures   ? ? ?Medications Ordered in ED ?Medications  ?ketorolac (TORADOL) injection 60 mg (has no administration in time range)  ? ? ?ED Course/ Medical Decision Making/ A&P ?  ?                        ?Medical Decision Making ?Amount and/or Complexity of Data Reviewed ?Labs: ordered. ? ?Risk ?Prescription drug management. ? ?This patient presents to the ED for concern of right flank pain, this involves a number of treatment options, and is a complaint that carries with it a high risk of complications and morbidity.  The differential diagnosis includes UTI, renal stone, musculoskeletal strain, PE, appendicitis, dissection, PID, ovarian torsion, TOA ? ? ?Co morbidities: ?Discussed in HPI ? ? ?Brief History: ?26 year old female with history of UTIs presenting with right flank pain started on Friday, noted with a fever 100.2 on Sunday.  Endorses some frequency in urination.  No blood in her urine.  Denies any vaginal bleeding or discharge.  No chest pain or shortness of breath. ? ?EMR reviewed including pt PMHx, past surgical history and past visits to ER.  ? ?See HPI for more details ? ? ?Lab Tests: ? ?I ordered and independently interpreted labs.  The pertinent results include:   ? ?Labs notable for normocytic anemia, hemoglobin 9 today slightly below baseline stable.  No active bleeding.  Mild hypokalemia 3.3.  Her UA shows moderate leukocytes, more than 50 WBCs, few bacteria, ketones and proteinuria. ? ? ?Imaging Studies: ? ?No imaging studies ordered for this patient ? ? ? ?Cardiac Monitoring: ? ?NA ? ? ? ?Medicines ordered: ? ?I ordered medication including Toradol for back pain ?Reevaluation of the patient after these medicines showed that the patient improved ?I have reviewed the patients home medicines and have made adjustments as needed ? ? ?Critical Interventions: ? ? N/A ? ? ?Consults: ? ?No consultations warranted ? ? ? ?Reevaluation: ? ?After the interventions noted above I re-evaluated patient and found that  they have :improved ? ? ? ? ?Problem List / ED Course: ? ?Patient presenting with right flank pain and low-grade fever.  Afebrile here today.  Vitals are stable.  CVA tenderness is minimal, not consistent with renal stone. She also does not have a significant RBC count to suggest renal stone.  She denies any pelvic pain, vaginal bleeding or discharge, low suspicion for STI at this time.   Her UA does show moderate leukocytes and more than 50 WBCs, consistent with UTI given symptoms.  Will send for culture.  Will send home with Keflex.  Patient's hemoglobin is more or less stable, consistent with a history of anemia.  No active bleeding.  She was informed of her mildly low potassium and encouraged to take repletion. ? ? ?Dispostion: ? ?After consideration of the diagnostic results and the patients response to treatment, I feel that the patent would benefit from discharge with antibiotics ? ? ?Final Clinical Impression(s) / ED Diagnoses ?Final diagnoses:  ?None  ? ? ?Rx / DC Orders ?ED Discharge Orders   ? ? None  ? ?  ? ? ?  ?Mare Ferrari, PA-C ?08/16/21 0020 ? ?  ?Melene Plan, DO ?08/16/21 1552 ? ?

## 2021-08-14 NOTE — ED Triage Notes (Signed)
Pt. Stated, Catherine Winters had back pain since Friday and Tylenol has not helped.  ?

## 2021-08-14 NOTE — ED Notes (Signed)
Lab to add on urine culture

## 2021-08-16 LAB — URINE CULTURE: Culture: >=100000 — AB

## 2021-08-17 ENCOUNTER — Telehealth: Payer: Self-pay | Admitting: *Deleted

## 2021-08-17 NOTE — Telephone Encounter (Signed)
Post ED Visit - Positive Culture Follow-up ? ?Culture report reviewed by antimicrobial stewardship pharmacist: ?[place] Team ?[]  Elenor Quinones, Pharm.D. ?[]  Heide Guile, Pharm.D., BCPS AQ-ID ?[]  Parks Neptune, Pharm.D., BCPS ?[]  Alycia Rossetti, Pharm.D., BCPS ?[]  [place], Pharm.D., BCPS, AAHIVP ?[]  Legrand Como, Pharm.D., BCPS, AAHIVP ?[]  Salome Arnt, PharmD, BCPS ?[]  Johnnette Gourd, PharmD, BCPS ?[]  Hughes Better, PharmD, BCPS ?[]  Leeroy Cha, PharmD ?[]  Laqueta Linden, PharmD, BCPS ?[]  Albertina Parr, PharmD ? ?[place] Team ?[]  Leodis Sias, PharmD ?[]  Lindell Spar, PharmD ?[]  Royetta Asal, PharmD ?[]  Graylin Shiver, Rph ?[]  Rema Fendt) Glennon Mac, PharmD ?[]  Arlyn Dunning, PharmD ?[]  Netta Cedars, PharmD ?[]  Dia Sitter, PharmD ?[]  Leone Haven, PharmD ?[]  Gretta Arab, PharmD ?[]  Theodis Shove, PharmD ?[]  Peggyann Juba, PharmD ?[]  Reuel Boom, PharmD ? ? ?Positive urine culture ?Treated with Cephalexin, organism sensitive to the same and no further patient follow-up is required at this time.  Bertis Ruddy, PharmD ? ?Ardeen Fillers ?08/17/2021, 7:48 AM ?  ?

## 2021-08-18 ENCOUNTER — Telehealth: Payer: Self-pay | Admitting: *Deleted

## 2021-08-18 NOTE — Telephone Encounter (Signed)
Post ED Visit - Positive Culture Follow-up ? ?Culture report reviewed by antimicrobial stewardship pharmacist: ?[facility] ?[]  , Enzo Bi.D. ?[]  [address], Pharm.D., BCPS AQ-ID ?[]  , Pharm.D., BCPS ?[]  Celedonio Miyamoto, Pharm.D., BCPS ?[]  [place], Garvin Fila.D., BCPS, AAHIVP ?[]  , Pharm.D., BCPS, AAHIVP ?[]  Georgina Pillion, PharmD, BCPS ?[]  , PharmD, BCPS ?[]  [place], PharmD, BCPS ?[]  [address], PharmD ?[]  , PharmD, BCPS ?[]  Estella Husk, PharmD ? ? [facility] ?[]  Lysle Pearl, PharmD ?[]  , PharmD ?[]  Phillips Climes, PharmD ?[]  , Rph ?[]  Agapito Games) , PharmD ?[]  Verlan Friends, PharmD ?[]  , PharmD ?[]  Mervyn Gay, PharmD ?[]  , PharmD ?[]  Vinnie Level, PharmD ?[]  Gerri Spore, PharmD ?[]  , PharmD ?[]  Len Childs, PharmD ? ? ?Positive urine culture ?Treated with Cephalexin, organism sensitive to the same and no further patient follow-up is required at this time.  , PharmD ? ?Greer Pickerel ?08/18/2021, 7:51 AM ?  ?

## 2021-11-22 ENCOUNTER — Other Ambulatory Visit: Payer: Self-pay | Admitting: Internal Medicine

## 2021-11-23 LAB — LIPID PANEL
Cholesterol: 133 mg/dL (ref <200)
HDL: 73 mg/dL (ref >=50)
LDL Cholesterol (Calc): 47 mg/dL (calc)
Non-HDL Cholesterol (Calc): 60 mg/dL (calc) (ref <130)
Total CHOL/HDL Ratio: 1.8 (calc) (ref <5.0)
Triglycerides: 44 mg/dL (ref <150)

## 2021-11-23 LAB — CBC
HCT: 33 % — ABNORMAL LOW (ref 35.0–45.0)
Hemoglobin: 10.3 g/dL — ABNORMAL LOW (ref 11.7–15.5)
MCH: 27.3 pg (ref 27.0–33.0)
MCHC: 31.2 g/dL — ABNORMAL LOW (ref 32.0–36.0)
MCV: 87.5 fL (ref 80.0–100.0)
MPV: 11.3 fL (ref 7.5–12.5)
Platelets: 272 10*3/uL (ref 140–400)
RBC: 3.77 10*6/uL — ABNORMAL LOW (ref 3.80–5.10)
RDW: 13.4 % (ref 11.0–15.0)
WBC: 4.8 10*3/uL (ref 3.8–10.8)

## 2021-11-23 LAB — COMPLETE METABOLIC PANEL WITH GFR
AG Ratio: 1.5 (calc) (ref 1.0–2.5)
ALT: 11 U/L (ref 6–29)
AST: 14 U/L (ref 10–30)
Albumin: 4.5 g/dL (ref 3.6–5.1)
Alkaline phosphatase (APISO): 70 U/L (ref 31–125)
BUN: 11 mg/dL (ref 7–25)
CO2: 25 mmol/L (ref 20–32)
Calcium: 9.5 mg/dL (ref 8.6–10.2)
Chloride: 102 mmol/L (ref 98–110)
Creat: 0.79 mg/dL (ref 0.50–0.96)
Globulin: 3 g/dL (calc) (ref 1.9–3.7)
Glucose, Bld: 74 mg/dL (ref 65–99)
Potassium: 4.3 mmol/L (ref 3.5–5.3)
Sodium: 139 mmol/L (ref 135–146)
Total Bilirubin: 0.5 mg/dL (ref 0.2–1.2)
Total Protein: 7.5 g/dL (ref 6.1–8.1)
eGFR: 105 mL/min/{1.73_m2} (ref >=60)

## 2021-11-23 LAB — VITAMIN D 25 HYDROXY (VIT D DEFICIENCY, FRACTURES): Vit D, 25-Hydroxy: 26 ng/mL — ABNORMAL LOW (ref 30–100)

## 2022-04-02 ENCOUNTER — Other Ambulatory Visit: Payer: Self-pay | Admitting: Internal Medicine

## 2022-04-06 LAB — PROGESTERONE, LC/MS: PROGESTERONE, LC/MS: <0.1 ng/mL

## 2022-04-10 LAB — CBC
HCT: 31.3 % — ABNORMAL LOW (ref 35.0–45.0)
Hemoglobin: 10 g/dL — ABNORMAL LOW (ref 11.7–15.5)
MCH: 27.3 pg (ref 27.0–33.0)
MCHC: 31.9 g/dL — ABNORMAL LOW (ref 32.0–36.0)
MCV: 85.5 fL (ref 80.0–100.0)
MPV: 11.5 fL (ref 7.5–12.5)
Platelets: 319 10*3/uL (ref 140–400)
RBC: 3.66 10*6/uL — ABNORMAL LOW (ref 3.80–5.10)
RDW: 12.3 % (ref 11.0–15.0)
WBC: 6.2 10*3/uL (ref 3.8–10.8)

## 2022-04-10 LAB — TSH: TSH: 1.22 mIU/L

## 2022-04-10 LAB — T3 UPTAKE: T3 Uptake: 27 % (ref 22–35)

## 2022-04-10 LAB — ESTROGENS, TOTAL: Estrogen: 72 pg/mL

## 2022-04-10 LAB — T3: T3, Total: 111 ng/dL (ref 76–181)

## 2022-04-10 LAB — T4, FREE: Free T4: 1.1 ng/dL (ref 0.8–1.8)

## 2022-04-10 LAB — PROLACTIN: Prolactin: 5.3 ng/mL

## 2022-04-10 LAB — DHEA-SULFATE: DHEA-SO4: 349 ug/dL (ref 14–349)

## 2024-01-01 ENCOUNTER — Other Ambulatory Visit (HOSPITAL_COMMUNITY): Payer: Self-pay | Admitting: Nurse Practitioner

## 2024-01-01 DIAGNOSIS — Z30431 Encounter for routine checking of intrauterine contraceptive device: Secondary | ICD-10-CM

## 2024-01-09 ENCOUNTER — Ambulatory Visit (HOSPITAL_COMMUNITY)
Admission: RE | Admit: 2024-01-09 | Discharge: 2024-01-09 | Disposition: A | Discharge status: Home / Self Care | Source: Ambulatory Visit | Attending: Nurse Practitioner | Admitting: Nurse Practitioner

## 2024-01-09 DIAGNOSIS — Z30431 Encounter for routine checking of intrauterine contraceptive device: Secondary | ICD-10-CM | POA: Diagnosis present
